# Patient Record
Sex: Female | Born: 1955 | Race: White | Hispanic: No | Marital: Married | Smoking: Never smoker
Health system: Southern US, Community
[De-identification: ages and names within clinical notes are randomized; demographics above are authoritative.]

## PROBLEM LIST (undated history)

## (undated) DIAGNOSIS — E28319 Asymptomatic premature menopause: Secondary | ICD-10-CM

## (undated) DIAGNOSIS — B019 Varicella without complication: Secondary | ICD-10-CM

## (undated) DIAGNOSIS — I10 Essential (primary) hypertension: Secondary | ICD-10-CM

## (undated) DIAGNOSIS — I480 Paroxysmal atrial fibrillation: Secondary | ICD-10-CM

## (undated) DIAGNOSIS — I4891 Unspecified atrial fibrillation: Secondary | ICD-10-CM

## (undated) DIAGNOSIS — E785 Hyperlipidemia, unspecified: Secondary | ICD-10-CM

## (undated) DIAGNOSIS — O24419 Gestational diabetes mellitus in pregnancy, unspecified control: Secondary | ICD-10-CM

## (undated) DIAGNOSIS — M2550 Pain in unspecified joint: Secondary | ICD-10-CM

## (undated) HISTORY — DX: Asymptomatic premature menopause: E28.319

## (undated) HISTORY — DX: Gestational diabetes mellitus in pregnancy, unspecified control: O24.419

## (undated) HISTORY — DX: Hyperlipidemia, unspecified: E78.5

## (undated) HISTORY — DX: Unspecified atrial fibrillation: I48.91

## (undated) HISTORY — PX: TONSILECTOMY, ADENOIDECTOMY, BILATERAL MYRINGOTOMY AND TUBES: SHX2538

## (undated) HISTORY — DX: Pain in unspecified joint: M25.50

## (undated) HISTORY — DX: Essential (primary) hypertension: I10

## (undated) HISTORY — DX: Varicella without complication: B01.9

## (undated) HISTORY — PX: ATRIAL FIBRILLATION ABLATION: SHX5732

---

## 2006-11-01 ENCOUNTER — Emergency Department (HOSPITAL_COMMUNITY): Admission: EM | Admit: 2006-11-01 | Discharge: 2006-11-02 | Payer: Self-pay | Admitting: Emergency Medicine

## 2006-12-18 ENCOUNTER — Emergency Department (HOSPITAL_COMMUNITY): Admission: EM | Admit: 2006-12-18 | Discharge: 2006-12-18 | Payer: Self-pay | Admitting: Emergency Medicine

## 2007-10-13 ENCOUNTER — Ambulatory Visit: Payer: Self-pay | Admitting: Internal Medicine

## 2007-10-13 DIAGNOSIS — R233 Spontaneous ecchymoses: Secondary | ICD-10-CM | POA: Insufficient documentation

## 2007-10-13 DIAGNOSIS — R5383 Other fatigue: Secondary | ICD-10-CM

## 2007-10-13 DIAGNOSIS — O99810 Abnormal glucose complicating pregnancy: Secondary | ICD-10-CM | POA: Insufficient documentation

## 2007-10-13 DIAGNOSIS — M2550 Pain in unspecified joint: Secondary | ICD-10-CM | POA: Insufficient documentation

## 2007-10-13 DIAGNOSIS — Z9189 Other specified personal risk factors, not elsewhere classified: Secondary | ICD-10-CM | POA: Insufficient documentation

## 2007-10-13 DIAGNOSIS — E785 Hyperlipidemia, unspecified: Secondary | ICD-10-CM | POA: Insufficient documentation

## 2007-10-13 DIAGNOSIS — I10 Essential (primary) hypertension: Secondary | ICD-10-CM | POA: Insufficient documentation

## 2007-10-13 DIAGNOSIS — J309 Allergic rhinitis, unspecified: Secondary | ICD-10-CM | POA: Insufficient documentation

## 2007-10-13 DIAGNOSIS — J301 Allergic rhinitis due to pollen: Secondary | ICD-10-CM | POA: Insufficient documentation

## 2007-10-13 DIAGNOSIS — E28319 Asymptomatic premature menopause: Secondary | ICD-10-CM | POA: Insufficient documentation

## 2007-10-13 DIAGNOSIS — R5381 Other malaise: Secondary | ICD-10-CM | POA: Insufficient documentation

## 2007-10-13 LAB — CONVERTED CEMR LAB
ALT: 20 units/L (ref 0–35)
AST: 16 units/L (ref 0–37)
Albumin: 3.9 g/dL (ref 3.5–5.2)
Alkaline Phosphatase: 52 units/L (ref 39–117)
BUN: 14 mg/dL (ref 6–23)
Basophils Relative: 0.6 % (ref 0.0–1.0)
Bilirubin Urine: NEGATIVE
Chloride: 104 meq/L (ref 96–112)
Cholesterol: 205 mg/dL (ref 0–200)
Creatinine, Ser: 0.8 mg/dL (ref 0.4–1.2)
Direct LDL: 151.7 mg/dL
Eosinophils Absolute: 0.1 10*3/uL (ref 0.0–0.7)
Eosinophils Relative: 3.2 % (ref 0.0–5.0)
GFR calc non Af Amer: 80 mL/min
Glucose, Bld: 98 mg/dL (ref 70–99)
HCT: 40.9 % (ref 36.0–46.0)
INR: 1.1 — ABNORMAL HIGH (ref 0.8–1.0)
Ketones, ur: NEGATIVE mg/dL
MCV: 92.7 fL (ref 78.0–100.0)
Monocytes Relative: 6.7 % (ref 3.0–12.0)
Neutrophils Relative %: 44.6 % (ref 43.0–77.0)
Nitrite: NEGATIVE
RBC: 4.42 M/uL (ref 3.87–5.11)
Total Protein, Urine: NEGATIVE mg/dL
Total Protein: 7.1 g/dL (ref 6.0–8.3)
Triglycerides: 121 mg/dL (ref 0–149)
WBC: 4 10*3/uL — ABNORMAL LOW (ref 4.5–10.5)
pH: 6 (ref 5.0–8.0)

## 2007-10-17 ENCOUNTER — Telehealth: Payer: Self-pay | Admitting: Internal Medicine

## 2007-10-17 LAB — CONVERTED CEMR LAB
Anti Nuclear Antibody(ANA): NEGATIVE
Vit D, 1,25-Dihydroxy: 25 — ABNORMAL LOW (ref 30–89)

## 2008-04-22 ENCOUNTER — Observation Stay (HOSPITAL_COMMUNITY): Admission: EM | Admit: 2008-04-22 | Discharge: 2008-04-23 | Payer: Self-pay | Admitting: Emergency Medicine

## 2008-04-22 ENCOUNTER — Ambulatory Visit: Payer: Self-pay | Admitting: Internal Medicine

## 2008-06-06 ENCOUNTER — Encounter: Payer: Self-pay | Admitting: Internal Medicine

## 2008-06-06 ENCOUNTER — Ambulatory Visit: Payer: Self-pay

## 2008-06-13 ENCOUNTER — Ambulatory Visit: Payer: Self-pay | Admitting: Internal Medicine

## 2008-06-26 ENCOUNTER — Ambulatory Visit: Payer: Self-pay | Admitting: Internal Medicine

## 2008-06-26 ENCOUNTER — Encounter: Payer: Self-pay | Admitting: Internal Medicine

## 2008-11-08 ENCOUNTER — Ambulatory Visit: Payer: Self-pay | Admitting: Internal Medicine

## 2008-11-08 DIAGNOSIS — I498 Other specified cardiac arrhythmias: Secondary | ICD-10-CM | POA: Insufficient documentation

## 2008-11-08 DIAGNOSIS — I4891 Unspecified atrial fibrillation: Secondary | ICD-10-CM | POA: Insufficient documentation

## 2008-11-08 LAB — CONVERTED CEMR LAB
ALT: 23 units/L (ref 0–35)
AST: 22 units/L (ref 0–37)
Albumin: 3.9 g/dL (ref 3.5–5.2)
Alkaline Phosphatase: 46 units/L (ref 39–117)
Bilirubin, Direct: 0.1 mg/dL (ref 0.0–0.3)
Cholesterol: 146 mg/dL (ref 0–200)
Total Bilirubin: 1 mg/dL (ref 0.3–1.2)
Total CHOL/HDL Ratio: 4
Total Protein: 6.9 g/dL (ref 6.0–8.3)
Triglycerides: 65 mg/dL (ref 0.0–149.0)

## 2008-11-13 ENCOUNTER — Encounter: Payer: Self-pay | Admitting: Internal Medicine

## 2009-01-27 ENCOUNTER — Telehealth: Payer: Self-pay | Admitting: Internal Medicine

## 2009-06-23 ENCOUNTER — Ambulatory Visit: Payer: Self-pay | Admitting: Internal Medicine

## 2010-04-28 ENCOUNTER — Encounter: Payer: Self-pay | Admitting: Internal Medicine

## 2010-04-28 ENCOUNTER — Ambulatory Visit: Payer: Self-pay | Admitting: Internal Medicine

## 2010-06-10 NOTE — Assessment & Plan Note (Signed)
Summary: 6 month/dmp    Visit Type:  Follow-up Primary Provider:  Corwin Levins MD   History of Present Illness: [name] returns today for followup.  She is a pleasant middle aged woman with a h/o PAF who was started on flecainide several months ago after presenting in atrial fibrillation.   No syncope. When I saw her last, she had marked sinus bradycardia and she had her beta blocker stopped.  Her energy level has improved.  She notes some problem with libido but she thinks it may be related to menopause.  Current Medications (verified): 1)  Simvastatin 40 Mg  Tabs (Simvastatin) .Marland Kitchen.. 1 By Mouth Once Daily 2)  Flecainide Acetate 100 Mg Tabs (Flecainide Acetate) .... Once Twice A Daily 3)  Zyrtec Hives Relief 10 Mg Tabs (Cetirizine Hcl) .Marland Kitchen.. 1 By Mouth Once Daily 4)  Aspirin Ec 325 Mg Tbec (Aspirin) .... Take One Tablet By Mouth Daily 5)  Multivitamins   Tabs (Multiple Vitamin) .Marland Kitchen.. 1 By Mouth Once Daily  Allergies: 1)  ! Tetracycline  Past History:  Past Medical History: Last updated: 11/07/2008 PREMATURE MENOPAUSE (ICD-256.31) POLYARTHRALGIA (ICD-719.49) FATIGUE (ICD-780.79) ECCHYMOSES, SPONTANEOUS (ICD-782.7) PREVENTIVE HEALTH CARE (ICD-V70.0) ALLERGIC RHINITIS (ICD-477.9) HYPERTENSION (ICD-401.9) HYPERLIPIDEMIA (ICD-272.4) DIABETES MELLITUS, GESTATIONAL (ICD-648.80) CHICKENPOX, HX OF (ICD-V15.9) HAY FEVER (ICD-477.0)  Review of Systems  The patient denies chest pain, syncope, dyspnea on exertion, and peripheral edema.    Vital Signs:  Patient profile:   55 year old female Height:      67.5 inches Weight:      208 pounds Pulse rate:   64 / minute BP sitting:   150 / 100  (left arm)  Vitals Entered By: Laurance Flatten CMA (June 23, 2009 9:20 AM)  Physical Exam  General:  Well developed, well nourished, in no acute distress.  HEENT: normal Neck: supple. No JVD. Carotids 2+ bilaterally no bruits Cor: RRR no rubs, gallops or murmur Lungs: CTA Ab: soft,  nontender. nondistended. No HSM. Good bowel sounds Ext: warm. no cyanosis, clubbing or edema Neuro: alert and oriented. Grossly nonfocal. affect pleasant    EKG  Procedure date:  06/23/2009  Findings:      Normal sinus rhythm with rate of:  65. Poor R wave progression.  Impression & Recommendations:  Problem # 1:  ATRIAL FIBRILLATION (ICD-427.31)  Her symptoms are well controlled.  Continue current meds.  I have encouraged her to start back with regular walking. The following medications were removed from the medication list:    Metoprolol Tartrate 25 Mg Tabs (Metoprolol tartrate) .Marland Kitchen... Take one tablet by mouth twice a day Her updated medication list for this problem includes:    Flecainide Acetate 100 Mg Tabs (Flecainide acetate) ..... Once twice a daily    Aspirin Ec 325 Mg Tbec (Aspirin) .Marland Kitchen... Take one tablet by mouth daily  The following medications were removed from the medication list:    Metoprolol Tartrate 25 Mg Tabs (Metoprolol tartrate) .Marland Kitchen... Take one tablet by mouth twice a day Her updated medication list for this problem includes:    Flecainide Acetate 100 Mg Tabs (Flecainide acetate) ..... Once twice a daily    Aspirin Ec 325 Mg Tbec (Aspirin) .Marland Kitchen... Take one tablet by mouth daily  Problem # 2:  BRADYCARDIA (ICD-427.89)  Her symptoms are improved with removal of the beta blocker. The following medications were removed from the medication list:    Metoprolol Tartrate 25 Mg Tabs (Metoprolol tartrate) .Marland Kitchen... Take one tablet by mouth twice a day  Her updated medication list for this problem includes:    Flecainide Acetate 100 Mg Tabs (Flecainide acetate) ..... Once twice a daily    Aspirin Ec 325 Mg Tbec (Aspirin) .Marland Kitchen... Take one tablet by mouth daily  The following medications were removed from the medication list:    Metoprolol Tartrate 25 Mg Tabs (Metoprolol tartrate) .Marland Kitchen... Take one tablet by mouth twice a day Her updated medication list for this problem includes:     Flecainide Acetate 100 Mg Tabs (Flecainide acetate) ..... Once twice a daily    Aspirin Ec 325 Mg Tbec (Aspirin) .Marland Kitchen... Take one tablet by mouth daily  Problem # 3:  HYPERTENSION (ICD-401.9) Her blood pressure is on the high side today.  If it remains elevated when we see her back, then additional medication will be required.  A low sodium diet is recommended. The following medications were removed from the medication list:    Metoprolol Tartrate 25 Mg Tabs (Metoprolol tartrate) .Marland Kitchen... Take one tablet by mouth twice a day Her updated medication list for this problem includes:    Aspirin Ec 325 Mg Tbec (Aspirin) .Marland Kitchen... Take one tablet by mouth daily  Patient Instructions: 1)  Your physician recommends that you schedule a follow-up appointment in: 6months with Dr Ladona Ridgel Prescriptions: FLECAINIDE ACETATE 100 MG TABS (FLECAINIDE ACETATE) once twice a daily  #180 x 3   Entered by:   Dennis Bast, RN, BSN   Authorized by:   Laren Boom, MD, [facility]   Signed by:   Dennis Bast, RN, BSN on 06/23/2009   Method used:   Faxed to ...       [facility] ([facility])       [address] [place], [place]  [address]       Ph: [phone]       Fax: [phone]   RxID:   [id number]

## 2010-06-11 NOTE — Assessment & Plan Note (Signed)
Summary: rov per pt call/f/u on a-fib/lg    Visit Type:  Follow-up Primary Rana Adorno:  Corwin Levins MD   History of Present Illness: [name] returns today for followup of her atrial fib and HTN.  She is a pleasant 55 yo woman with a h/o atrial fib who was placed on flecainide over a year ago.  She has dones well since I last saw her 9 months ago with only minimal palpitations.  No c/p or peripheral edema.   Current Medications (verified): 1)  Simvastatin 40 Mg  Tabs (Simvastatin) .Marland Kitchen.. 1 By Mouth Once Daily 2)  Flecainide Acetate 100 Mg Tabs (Flecainide Acetate) .... Once Twice A Daily 3)  Zyrtec Hives Relief 10 Mg Tabs (Cetirizine Hcl) .Marland Kitchen.. 1 By Mouth Once Daily 4)  Aspirin Ec 325 Mg Tbec (Aspirin) .... Take One Tablet By Mouth Daily 5)  Multivitamins   Tabs (Multiple Vitamin) .Marland Kitchen.. 1 By Mouth Once Daily  Allergies: 1)  ! Tetracycline  Past History:  Past Medical History: Last updated: 11/07/2008 PREMATURE MENOPAUSE (ICD-256.31) POLYARTHRALGIA (ICD-719.49) FATIGUE (ICD-780.79) ECCHYMOSES, SPONTANEOUS (ICD-782.7) PREVENTIVE HEALTH CARE (ICD-V70.0) ALLERGIC RHINITIS (ICD-477.9) HYPERTENSION (ICD-401.9) HYPERLIPIDEMIA (ICD-272.4) DIABETES MELLITUS, GESTATIONAL (ICD-648.80) CHICKENPOX, HX OF (ICD-V15.9) HAY FEVER (ICD-477.0)  Past Surgical History: Last updated: 10/13/2007 Tonsillectomy  Review of Systems  The patient denies chest pain, syncope, dyspnea on exertion, and peripheral edema.    Vital Signs:  Patient profile:   55 year old female Height:      67.5 inches Weight:      202 pounds BMI:     31.28 Pulse rate:   66 / minute BP sitting:   130 / 88  (left arm)  Vitals Entered By: Laurance Flatten CMA (April 28, 2010 11:20 AM)  Physical Exam  General:  Well developed, well nourished, in no acute distress.  HEENT: normal Neck: supple. No JVD. Carotids 2+ bilaterally no bruits Cor: RRR no rubs, gallops or murmur Lungs: CTA Ab: soft, nontender.  nondistended. No HSM. Good bowel sounds Ext: warm. no cyanosis, clubbing or edema Neuro: alert and oriented. Grossly nonfocal. affect pleasant    EKG  Procedure date:  04/28/2010  Findings:      Normal sinus rhythm with rate of:  66.  Impression & Recommendations:  Problem # 1:  ATRIAL FIBRILLATION (ICD-427.31) Her symptoms have been well controlled.  She will continue her flecainide.   Her updated medication list for this problem includes:    Flecainide Acetate 100 Mg Tabs (Flecainide acetate) ..... Once twice a daily    Aspirin Ec 325 Mg Tbec (Aspirin) .Marland Kitchen... Take one tablet by mouth daily  Problem # 2:  HYPERTENSION (ICD-401.9) Her blood pressure appears to be well controlled on no medical therapy.  She will continue a low sodium diet. Her updated medication list for this problem includes:    Aspirin Ec 325 Mg Tbec (Aspirin) .Marland Kitchen... Take one tablet by mouth daily  Problem # 3:  DYSLIPIDEMIA (ICD-272.4) I have asked her to reduce her fat intake and to lose weight. She will continue simvastatin. Her updated medication list for this problem includes:    Simvastatin 40 Mg Tabs (Simvastatin) .Marland Kitchen... 1 by mouth once daily  Patient Instructions: 1)  Your physician wants you to follow-up in 12 month follow up with Dr Ladona Ridgel:   You will receive a reminder letter in the mail two months in advance. If you don't receive a letter, please call our office to schedule the follow-up appointment. Prescriptions: SIMVASTATIN 40 MG  TABS (SIMVASTATIN) 1 by mouth once daily  #90 Tablet x 3   Entered by:   Dennis Bast, RN, BSN   Authorized by:   Laren Boom, MD, [facility]   Signed by:   Dennis Bast, RN, BSN on 04/28/2010   Method used:   [facility]  [address] [id number]       [address]       [place], [place]  [address]       Ph: [phone] or [phone]       Fax: [phone]   RxID:   [id number] FLECAINIDE ACETATE 100 MG TABS  (FLECAINIDE ACETATE) once twice a daily  #180 x 3   Entered by:   Dennis Bast, RN, BSN   Authorized by:   Laren Boom, MD, [facility]   Signed by:   Dennis Bast, RN, BSN on 04/28/2010   Method used:   [facility]        [facility]  [address] [id number]       [address]       [place], [place]  [address]       Ph: [phone] or [phone]       Fax: [phone]   RxID:   [id number]   Prevention & Chronic Care Immunizations   Influenza vaccine: Not documented    Tetanus booster: 08/09/2007: given    Pneumococcal vaccine: Not documented  Colorectal Screening   Hemoccult: Not documented    Colonoscopy: Not documented  Other Screening   Pap smear: normal  (04/11/2006)    Mammogram: normal  (04/11/2006)   Smoking status: never  (10/13/2007)  Lipids   Total Cholesterol: 146  (11/08/2008)   LDL: 91  (11/08/2008)   LDL Direct: 151.7  (10/13/2007)   HDL: 41.70  (11/08/2008)   Triglycerides: 65.0  (11/08/2008)    SGOT (AST): 22  (11/08/2008)   SGPT (ALT): 23  (11/08/2008)   Alkaline phosphatase: 46  (11/08/2008)   Total bilirubin: 1.0  (11/08/2008)  Hypertension   Last Blood Pressure: 130 / 88  (04/28/2010)   Serum creatinine: 0.8  (10/13/2007)   Serum potassium 4.2  (10/13/2007)  Self-Management Support :    Hypertension self-management support: Not documented    Lipid self-management support: Not documented

## 2010-09-22 NOTE — Assessment & Plan Note (Signed)
[facility]                         ELECTROPHYSIOLOGY OFFICE NOTE   COLISHA, REDLER                  MRN:          [id number]  DATE:06/13/2008                            DOB:          04-Jun-1955    [name] returns today for followup.  I saw her in the emergency room  at [facility] early in December when she presented with atrial  fibrillation and rapid ventricular response.  The patient at that time  was treated with flecainide and went back to sinus rhythm.  In the  interim, she has gone about 6 weeks on beta-blocker therapy and she  notes that she has been out of rhythm 3 or 4 times during that period,  typically lasting several hours at a time.  This symptoms have not been  quite as severe as they were when she was in the emergency room and now  that she is on beta-blockers, but she still feels palpitations and some  dyspnea with her AFib.   CURRENT MEDICATIONS:  1. Metoprolol 25 mg half tablet twice daily.  2. Simvastatin 40 a day.   PHYSICAL EXAMINATION:  GENERAL:  She is a pleasant well-appearing middle-  aged woman in no acute distress.  VITAL SIGNS:  Blood pressure was 150/70, the pulse was 130 and  irregularly irregular, respirations were 18, and the weight was 199  pounds.  NECK:  No jugular venous distention.  LUNGS:  Clear bilaterally to auscultation.  No wheezes, rales, or  rhonchi are present.  There is no increased work of breathing.  CARDIOVASCULAR:  Irregularly regular tachycardia with normal S1 and S2.  ABDOMEN:  Soft and nontender.  EXTREMITIES:  No cyanosis, clubbing, or edema.  The pulses are 2+ and  symmetric.   The EKG demonstrates atrial fibrillation with a rapid ventricular  response at 157 beats per minute.   IMPRESSION:  1. Paroxysmal atrial fibrillation.  Currently in atrial fibrillation,      though not with any evidence of heart failure.  2. Dyslipidemia.   DISCUSSION:  Overall, [name] is  stable except that she is presently  out of rhythm in AFib.  Because she is stable and does not appear to be  clinically in any distress, I have not recommended that she be  hospitalized today.  However, I have recommended because of the  increasingly frequent episodes of her AFib that we start her on  flecainide and we will give her prescription today for 100 mg twice  daily.  We will have her come back in 7-10 days for an exercise  treadmill test to rule out proarrhythmia on flecainide.  I noted that  her 2-D echo was done several weeks ago, demonstrated a structurally  normal heart with normal left atrial and right atrial sizes and  dimensions and no significant valvular problems.  Hopefully, we can  maintain sinus rhythm with a strategy of rhythm control on Mr. [name].  Today, we also briefly  talked about ablation therapy for AFib, but I think at the present time,  we will try her on flecainide to see if we can control arrhythmias in  this way.     Doylene Canning. Ladona Ridgel, MD  Electronically Signed    GWT/MedQ  DD: 06/13/2008  DT: 06/14/2008  Job #: [id number]   cc:   Corwin Levins, MD

## 2010-09-22 NOTE — Discharge Summary (Signed)
[name], [name]         ACCOUNT Harris.:  [id number]   MEDICAL RECORD Harris.:  [id number]          PATIENT TYPE:  OBV   LOCATION:  1433                         FACILITY:  [facility]   PHYSICIAN:  Rollene Rotunda, MD, FACCDATE OF BIRTH:  1955-11-14   DATE OF ADMISSION:  04/22/2008  DATE OF DISCHARGE:                               DISCHARGE SUMMARY   PRIMARY FINAL DISCHARGE DIAGNOSIS:  Paroxysmal atrial fibrillation with  rapid ventricular response.   SECONDARY DIAGNOSES:  1. Polyarthralgia/fatigue.  2. Premature menopause.  3. Allergic rhinitis.  4. Hypertension.  5. Hyperlipidemia with a total cholesterol 193, triglycerides 82, HDL      51, LDL 126 on medication.  6. History of gestational diabetes.   Time at discharge:  41 minutes.   HOSPITAL COURSE:  [name] is a 55 year old female with Harris  previous history of coronary artery disease.  She has a long history of  palpitations which were brief and never caught on telemetry.  When she  developed palpitations on the day of admission and they lasted a  prolonged period of time, she came to the emergency room where she was  found to be in atrial fibrillation rapid ventricular rate.   Her cardiac enzymes were negative for MI.  Her TSH was within normal  limits at 2.125.  Chest x-ray showed Harris acute disease.  A nonfasting  blood sugar was 128.  Her other labs were within normal limits except  for a mildly elevated hemoglobin at 15.6 and a mildly elevated  hematocrit at 46.1.   [name] was given IV Diltiazem for rate control, but her heart  rate was still greater than 100.  She was also started on heparin and  metoprolol 25 mg q.6 hours.  She was given a flecainide challenge with  200 mg in the emergency room.  At 5:30 she converted to sinus rhythm  with a heart rate in the 70s.  Once her heart rate converted to sinus  rhythm, she was asymptomatic.   On April 23, 2008 [name] was seen by Dr.  Antoine Poche.  She  was maintaining sinus rhythm, but because of a systolic blood pressure  less than 100 one of the doses of metoprolol had been held and because  of a heart rate less than 60 another one had been held.  Dr. Antoine Poche  therefore discontinued the Cardizem and decreased the beta blocker.  As  [name] was, otherwise, asymptomatic he felt that she could be  considered stable for discharge home on a decreased dose of beta blocker  and with outpatient follow-up and echocardiogram.   DISCHARGE INSTRUCTIONS:  1. Her activity level is to be increased gradually.  2. She is encouraged to stick to a low sodium, heart-healthy diet.  3. She is to call our office for any further episodes of palpitations.  4. She is to get an echocardiogram January the 11th at 1 p.m. and see      Dr. Ladona Ridgel at 2:30.  She is to follow up with Dr. Jonny Ruiz as needed.   DISCHARGE MEDICATIONS:  1. Metoprolol 25 mg 1/2 tab b.i.d.  2. Nasacort  daily.  3. Simvastatin 40 mg a day.  4. Zyrtec 10 mg daily.      Theodore Demark, PA-C      Rollene Rotunda, MD, [facility]  Electronically Signed    RB/MEDQ  D:  04/23/2008  T:  04/23/2008  Job:  [id number]   cc:   Corwin Levins, MD  [address]  [place]  [place] [address]

## 2010-09-22 NOTE — H&P (Signed)
NAMESHERILL, MANGEN         ACCOUNT NO.:  [id number]   MEDICAL RECORD NO.:  [id number]          PATIENT TYPE:  OBV   LOCATION:  0104                         FACILITY:  [facility]   PHYSICIAN:  Doylene Canning. Ladona Ridgel, MD    DATE OF BIRTH:  [birth year]   DATE OF ADMISSION:  04/22/2008  DATE OF DISCHARGE:                              HISTORY & PHYSICAL   ADMITTING DIAGNOSIS:  New-onset atrial fibrillation with a rapid  ventricular response associated with shortness of breath.   CHIEF COMPLAINT:  Palpitations and shortness of breath.   HISTORY OF PRESENT ILLNESS:  The patient is a very pleasant 55 year old  woman with a history of palpitations in the past, but never diagnosed  with atrial fibrillation.  She has a history of acute on chronic  sinusitis and associated headaches.  The patient was in her usual state  of health when she woke up this morning complaining of some dizziness  and associated palpitations.  Despite this, she went to work but her  symptoms worsened.  She became lightheaded though she did not have any  frank syncope.  She noted weakness and fatigue and nausea.  She  subsequently went to the emergency department where she was found to be  in atrial fibrillation with a rapid ventricular response and is admitted  for evaluation.  She has been given intravenous Cardizem.  Her  ventricular rate and atrial fibrillation remain elevated.  She has had  no frank syncope.  She denies classic anginal symptoms.  She denies any  history of or symptoms of hyperthyroidism.   CURRENT MEDICATIONS:  1. Zyrtec 10 a day.  2. Nasacort.  3. Vitamin D.  4. Zocor 40 a day.   PAST MEDICAL HISTORY:  1. Dyslipidemia.  2. Borderline hypertension.   SOCIAL HISTORY:  The patient is married and lives in [place].  She works as a Immunologist.  She denies tobacco or ethanol abuse.   FAMILY HISTORY:  Not contributory.   REVIEW OF SYSTEMS:  Notable for problems with chronic congestion  and  nasal discharge and headache associated with her sinusitis.  She denies  any fevers or chills, or significant cough.  Of course, she has  palpitations and shortness of breath with her new-onset atrial  fibrillation.  Otherwise, the rest of her review of systems was negative  except as noted in the HPI.   PHYSICAL EXAMINATION:  GENERAL:  She is a pleasant, well-appearing, 55-  year-old woman in no acute distress.  VITAL SIGNS:  The blood pressure was 180/130.  The pulse was 148 and  irregularly irregular.  Respirations are 24.  Temperature is 98.  HEENT:  Normocephalic and atraumatic. Pupils equal and round.  The  oropharynx is moist.  The sclerae are anicteric.  The neck revealed 7 cm  jugular venous distention.  There is no thyromegaly.  The trachea is  midline.  The carotids are 2+ and symmetric.  CHEST:  The lungs revealed rales in the bases bilaterally.  No wheezes  or rhonchi.  There is no increased work at breathing.  CARDIOVASCULAR:  Irregularly irregular tachycardia with normal S1 and  S2.  PMI was not enlarged nor laterally displaced.  I did not appreciate  any murmurs in the emergency room.  ABDOMEN:  Soft, nontender.  There is no organomegaly.  The bowel sounds  are present.  No rebound or guarding.  EXTREMITIES:  No cyanosis, clubbing, or edema.  There were erythematous  macules over the lower extremities; etiology is unknown as to what these  might be.   Her EKG demonstrates atrial fibrillation with a rapid ventricular  response.  Her labs demonstrate a normal hemoglobin, normal kidney  function, normal potassium.   INITIAL POINT OF CARE:  Cardiac enzymes are negative.   IMPRESSION:  1. New-onset atrial fibrillation with a rapid ventricular response.  2. Shortness of breath secondary to #1 without any diagnosis of heart      failure in the past.  3. Borderline hypertension.  4. Chronic sinusitis, questionably related to #1.   DISCUSSION:  We will plan to admit  the patient to the hospital and  continue IV Cardizem.  We will give her some beta blockers.  We will  give her some flecainide in hopes of trying to convert her back to sinus  rhythm.  We will obtain serial cardiac enzymes.  If she converts to  sinus, then we will plan for early discharge and outpatient followup  with a 2-D echo.  If she remains in atrial fibrillation, then  consideration for DC cardioversion would be considered and this would be  scheduled tomorrow.  We will plan to make sure the patient remains  n.p.o. after midnight tonight.  Obviously, if her cardiac enzymes were  remarkably elevated, then we would evaluate for coronary disease, as  well.  Thyroids will be checked, as well.      Doylene Canning. Ladona Ridgel, MD  Electronically Signed     GWT/MEDQ  D:  04/22/2008  T:  04/22/2008  Job:  [id number]   cc:   Corwin Levins, MD  [address]  [place]  [place] [address]

## 2010-10-21 ENCOUNTER — Other Ambulatory Visit: Payer: Self-pay | Admitting: *Deleted

## 2010-10-21 MED ORDER — SIMVASTATIN 40 MG PO TABS: 40.0000 mg | ORAL_TABLET | Freq: Every evening | ORAL | Status: DC

## 2010-11-16 ENCOUNTER — Other Ambulatory Visit (HOSPITAL_COMMUNITY)
Admission: RE | Admit: 2010-11-16 | Discharge: 2010-11-16 | Disposition: A | Discharge status: Home / Self Care | Payer: 59 | Source: Ambulatory Visit | Attending: Obstetrics and Gynecology | Admitting: Obstetrics and Gynecology

## 2010-11-16 ENCOUNTER — Other Ambulatory Visit: Payer: Self-pay | Admitting: Obstetrics and Gynecology

## 2010-11-16 DIAGNOSIS — Z01419 Encounter for gynecological examination (general) (routine) without abnormal findings: Secondary | ICD-10-CM | POA: Insufficient documentation

## 2010-11-24 ENCOUNTER — Other Ambulatory Visit: Payer: Self-pay | Admitting: Obstetrics and Gynecology

## 2010-11-24 DIAGNOSIS — Z1231 Encounter for screening mammogram for malignant neoplasm of breast: Secondary | ICD-10-CM

## 2010-12-07 ENCOUNTER — Ambulatory Visit
Admission: RE | Admit: 2010-12-07 | Discharge: 2010-12-07 | Disposition: A | Discharge status: Home / Self Care | Payer: 59 | Source: Ambulatory Visit | Attending: Obstetrics and Gynecology | Admitting: Obstetrics and Gynecology

## 2010-12-07 DIAGNOSIS — Z1231 Encounter for screening mammogram for malignant neoplasm of breast: Secondary | ICD-10-CM

## 2011-02-12 LAB — TROPONIN I: Troponin I: 0.01 ng/mL (ref 0.00–0.06)

## 2011-02-12 LAB — POCT CARDIAC MARKERS
CKMB, poc: 1.5 ng/mL (ref 1.0–8.0)
Troponin i, poc: <0.05 ng/mL (ref 0.00–0.09)

## 2011-02-12 LAB — BASIC METABOLIC PANEL
CO2: 25 mEq/L (ref 19–32)
Chloride: 105 mEq/L (ref 96–112)
Creatinine, Ser: 0.81 mg/dL (ref 0.4–1.2)
GFR calc Af Amer: >60 mL/min (ref >60)

## 2011-02-12 LAB — CBC
MCHC: 33.8 g/dL (ref 30.0–36.0)
MCV: 94.1 fL (ref 78.0–100.0)
RBC: 4.9 MIL/uL (ref 3.87–5.11)

## 2011-02-12 LAB — CARDIAC PANEL(CRET KIN+CKTOT+MB+TROPI)
CK, MB: 1.2 ng/mL (ref 0.3–4.0)
CK, MB: 1.8 ng/mL (ref 0.3–4.0)
Troponin I: <0.01 ng/mL (ref 0.00–0.06)

## 2011-02-12 LAB — CK TOTAL AND CKMB (NOT AT ARMC)
CK, MB: 2.6 ng/mL (ref 0.3–4.0)
Total CK: 109 U/L (ref 7–177)

## 2011-02-12 LAB — LIPID PANEL
LDL Cholesterol: 126 mg/dL — ABNORMAL HIGH (ref 0–99)
Triglycerides: 82 mg/dL (ref <150)

## 2011-02-12 LAB — PROTIME-INR: Prothrombin Time: 13 seconds (ref 11.6–15.2)

## 2011-04-12 ENCOUNTER — Telehealth: Payer: Self-pay | Admitting: Internal Medicine

## 2011-04-12 MED ORDER — FLECAINIDE ACETATE 100 MG PO TABS: 100.0000 mg | ORAL_TABLET | Freq: Two times a day (BID) | ORAL | Status: DC

## 2011-04-12 NOTE — Telephone Encounter (Signed)
[facility]. [facility] [address]

## 2011-05-28 ENCOUNTER — Encounter: Payer: Self-pay | Admitting: Internal Medicine

## 2011-06-03 ENCOUNTER — Ambulatory Visit (INDEPENDENT_AMBULATORY_CARE_PROVIDER_SITE_OTHER): Payer: 59 | Admitting: Internal Medicine

## 2011-06-03 ENCOUNTER — Encounter: Payer: Self-pay | Admitting: Internal Medicine

## 2011-06-03 DIAGNOSIS — I10 Essential (primary) hypertension: Secondary | ICD-10-CM

## 2011-06-03 DIAGNOSIS — I4891 Unspecified atrial fibrillation: Secondary | ICD-10-CM

## 2011-06-03 DIAGNOSIS — E785 Hyperlipidemia, unspecified: Secondary | ICD-10-CM

## 2011-06-03 MED ORDER — SIMVASTATIN 40 MG PO TABS: 40.0000 mg | ORAL_TABLET | Freq: Every evening | ORAL | Status: DC

## 2011-06-03 MED ORDER — FLECAINIDE ACETATE 100 MG PO TABS: 100.0000 mg | ORAL_TABLET | Freq: Two times a day (BID) | ORAL | Status: DC

## 2011-06-03 NOTE — Progress Notes (Signed)
HPI [name] returns today for followup. She is a very pleasant 56 year old woman with a history of paroxysmal atrial fibrillation, dyslipidemia, and well-controlled hypertension. While on flecainide, she has maintained sinus rhythm very nicely. Whenever she misses a dose of her medication, she will have recurrent tachypalpitations. She denies syncope, chest pain, or shortness of breath. Allergies  Allergen Reactions  . Tetracycline     REACTION: rash     Current Outpatient Prescriptions  Medication Sig Dispense Refill  . flecainide (TAMBOCOR) 100 MG tablet Take 1 tablet (100 mg total) by mouth 2 (two) times daily.  180 tablet  3  . simvastatin (ZOCOR) 40 MG tablet Take 1 tablet (40 mg total) by mouth every evening.  30 tablet  11     Past Medical History  Diagnosis Date  . Premature menopause   . Polyarthralgia   . Fatigue   . Spontaneous ecchymoses   . Allergic rhinitis   . HTN (hypertension)   . Hyperlipidemia   . DM mellitus, gestational   . Chicken pox   . Hay fever     ROS:   All systems reviewed and negative except as noted in the HPI.   Past Surgical History  Procedure Date  . Doppler echocardiography 2010  . Tonsilectomy, adenoidectomy, bilateral myringotomy and tubes      Family History  Problem Relation Age of Onset  . Stroke Father     TIA/ pacemker  . Cancer Father   . Coronary artery disease Father   . Coronary artery disease Mother   . Stroke Other     grandfather  . Diabetes Other     grandmother  . Stroke Sister   . Stroke Other     aunt  . Stroke Other     unlce     History   Social History  . Marital Status: Married    Spouse Name: N/A    Number of Children: 2  . Years of Education: N/A   Occupational History  . Dept. Production designer, [employer] Other   Social History Main Topics  . Smoking status: Never Smoker   . Smokeless tobacco: Not on file  . Alcohol Use: Yes  . Drug Use: Not on file  . Sexually Active: Not on  file   Other Topics Concern  . Not on file   Social History Narrative  . No narrative on file     BP 124/80  Pulse 58  Ht 5' 7.5" (1.715 m)  Wt 89.867 kg (198 lb 1.9 oz)  BMI 30.57 kg/m2  Physical Exam:  Well appearing middle aged woman, NAD HEENT: Unremarkable Neck:  No JVD, no thyromegally Lungs:  Clear with no wheezes, rales, or rhonchi. HEART:  Regular rate rhythm, no murmurs, no rubs, no clicks Abd:  soft, positive bowel sounds, no organomegally, no rebound, no guarding Ext:  2 plus pulses, no edema, no cyanosis, no clubbing Skin:  No rashes no nodules Neuro:  CN II through XII intact, motor grossly intact  EKG Normal sinus rhythm. Left atrial enlargement  Assess/Plan:

## 2011-06-03 NOTE — Assessment & Plan Note (Signed)
Her blood pressure today is well controlled. She will maintain a low-sodium diet.

## 2011-06-03 NOTE — Assessment & Plan Note (Signed)
The patient is maintaining sinus rhythm very nicely on her antiarrhythmic drug therapy. She will continue her current medications. She is instructed to call as if she has breakthrough

## 2011-06-03 NOTE — Patient Instructions (Signed)
Your physician wants you to follow-up in: 12 months with Dr. Taylor. You will receive a reminder letter in the mail two months in advance. If you don't receive a letter, please call our office to schedule the follow-up appointment.    

## 2011-06-03 NOTE — Assessment & Plan Note (Signed)
She will continue her dose of simvastatin. A low-fat diet is recommended. She is losing weight by exercising.

## 2011-10-24 ENCOUNTER — Emergency Department (HOSPITAL_COMMUNITY)
Admission: EM | Admit: 2011-10-24 | Discharge: 2011-10-24 | Disposition: A | Discharge status: Home / Self Care | Payer: Private Health Insurance - Indemnity | Attending: Emergency Medicine | Admitting: Emergency Medicine

## 2011-10-24 ENCOUNTER — Encounter (HOSPITAL_COMMUNITY): Payer: Self-pay | Admitting: Family Medicine

## 2011-10-24 ENCOUNTER — Emergency Department (HOSPITAL_COMMUNITY): Payer: Private Health Insurance - Indemnity

## 2011-10-24 DIAGNOSIS — I10 Essential (primary) hypertension: Secondary | ICD-10-CM | POA: Insufficient documentation

## 2011-10-24 DIAGNOSIS — S6990XA Unspecified injury of unspecified wrist, hand and finger(s), initial encounter: Secondary | ICD-10-CM | POA: Insufficient documentation

## 2011-10-24 DIAGNOSIS — Y998 Other external cause status: Secondary | ICD-10-CM | POA: Insufficient documentation

## 2011-10-24 DIAGNOSIS — E785 Hyperlipidemia, unspecified: Secondary | ICD-10-CM | POA: Insufficient documentation

## 2011-10-24 DIAGNOSIS — W19XXXA Unspecified fall, initial encounter: Secondary | ICD-10-CM | POA: Insufficient documentation

## 2011-10-24 DIAGNOSIS — S59909A Unspecified injury of unspecified elbow, initial encounter: Secondary | ICD-10-CM | POA: Insufficient documentation

## 2011-10-24 DIAGNOSIS — Y9389 Activity, other specified: Secondary | ICD-10-CM | POA: Insufficient documentation

## 2011-10-24 NOTE — Discharge Instructions (Signed)
Elbow Injury   Minor fractures, sprains, and bruises of the elbow will all cause swelling and pain. X-rays often show swelling around the joint but may not show a fracture line on x-rays taken right after the injury. The treatment for all these types of injuries is to reduce swelling and pain and to rest the joint until movement is painless. Repeat exam and x-rays several weeks after an elbow injury may show a minor fracture not seen on the initial exam.   Most of the time a sling is needed for the first days or weeks after the injury. Apply ice packs to the elbow for 20-30 minutes every 2 hours for the next few days. Keep your elbow elevated above the level of your heart as much as possible until the pain and swelling are better. An elastic wrap or splint may also be used to reduce movement in addition to a sling. Call your caregiver for follow-up care within one week. Keeping the elbow immobilized for too long can hurt recovery.   SEEK MEDICAL CARE IF:   Your pain increases, or if you develop a numb, cold, or pale forearm or hand.   You are not improving.   You have any other questions or concerns regarding your injury.   Document Released: 06/03/2004 Document Revised: 04/15/2011 Document Reviewed: 05/15/2008   [employer] Patient Information 2012 [employer].

## 2011-10-24 NOTE — ED Notes (Signed)
Reports lost balance while getting dressed & fell this am. C/o pain right elbow, denies other injuries. No obvious deformity

## 2011-10-24 NOTE — ED Provider Notes (Signed)
History     CSN: [id number]  Arrival date & time 10/24/11  8469   First MD Initiated Contact with Patient 10/24/11 0820      Chief Complaint  Patient presents with  . Fall  . Arm Pain    (Consider location/radiation/quality/duration/timing/severity/associated sxs/prior treatment) HPI Comments: Patient reports that just prior to arrival she was putting on her pants and her legs got caught in the pants and she fell to the ground.  She landed on her right elbow.  She is now having pain of her right elbow.  She has not taken anything for pain.  Pain worse with movement and with palpation.  No swelling or bruising.  No numbness or tingling.  No prior injury to the elbow.    The history is provided by the patient.    Past Medical History  Diagnosis Date  . Premature menopause   . Polyarthralgia   . Fatigue   . Spontaneous ecchymoses   . Allergic rhinitis   . HTN (hypertension)   . Hyperlipidemia   . DM mellitus, gestational   . Chicken pox   . Hay fever     Past Surgical History  Procedure Date  . Doppler echocardiography 2010  . Tonsilectomy, adenoidectomy, bilateral myringotomy and tubes     Family History  Problem Relation Age of Onset  . Stroke Father     TIA/ pacemker  . Cancer Father   . Coronary artery disease Father   . Coronary artery disease Mother   . Stroke Other     grandfather  . Diabetes Other     grandmother  . Stroke Sister   . Stroke Other     aunt  . Stroke Other     unlce    History  Substance Use Topics  . Smoking status: Never Smoker   . Smokeless tobacco: Not on file  . Alcohol Use: Yes    OB History    Grav Para Term Preterm Abortions TAB SAB Ect Mult Living                  Review of Systems  Constitutional: Negative for fever and chills.  Gastrointestinal: Negative for nausea and vomiting.  Musculoskeletal: Negative for joint swelling.       Right elbow pain  Skin: Negative for color change and wound.  Neurological:  Negative for dizziness, syncope, weakness, light-headedness and numbness.    Allergies  Tetracycline  Home Medications   Current Outpatient Rx  Name Route Sig Dispense Refill  . ASPIRIN 325 MG PO TBEC Oral Take 325 mg by mouth daily.    Marland Kitchen VITAMIN D 1000 UNITS PO TABS Oral Take 1,000 Units by mouth daily.    Marland Kitchen FLECAINIDE ACETATE 100 MG PO TABS Oral Take 1 tablet (100 mg total) by mouth 2 (two) times daily. 60 tablet 11  . MULTI-VITAMIN/MINERALS PO TABS Oral Take 1 tablet by mouth daily.    Marland Kitchen SIMVASTATIN 40 MG PO TABS Oral Take 1 tablet (40 mg total) by mouth every evening. 30 tablet 11  . TRIAMCINOLONE ACETONIDE 55 MCG/ACT NA INHA Nasal Place 2 sprays into the nose daily.      BP 164/93  Pulse 65  Temp 98.2 F (36.8 C) (Oral)  Resp 20  SpO2 97%  Physical Exam  Nursing note and vitals reviewed. Constitutional: She appears well-developed and well-nourished.  HENT:  Head: Normocephalic and atraumatic.  Mouth/Throat: Oropharynx is clear and moist.  Cardiovascular: Normal rate, regular rhythm and  normal heart sounds.   Pulses:      Radial pulses are 2+ on the right side, and 2+ on the left side.  Pulmonary/Chest: Effort normal and breath sounds normal.  Musculoskeletal:       Right shoulder: She exhibits normal range of motion, no tenderness, no bony tenderness, no swelling, no deformity, normal pulse and normal strength.       Right elbow: She exhibits decreased range of motion. She exhibits no swelling, no effusion and no deformity. tenderness found. Medial epicondyle, lateral epicondyle and olecranon process tenderness noted.       Right wrist: She exhibits normal range of motion, no tenderness, no bony tenderness, no swelling and no deformity.  Neurological: She is alert. No sensory deficit. Gait normal.  Skin: Skin is warm and dry.  Psychiatric: She has a normal mood and affect.    ED Course  Procedures (including critical care time)  Labs Reviewed - No data to  display Dg Elbow Complete Right  10/24/2011  *RADIOLOGY REPORT*  Clinical Data: History of fall complaining of arm pain.  RIGHT ELBOW - COMPLETE 3+ VIEW  Comparison: No priors.  Findings: Four views of the right elbow demonstrate no acute fracture, subluxation, dislocation, joint or soft tissue abnormality.  IMPRESSION:  1.  No radiographic evidence of significant acute traumatic injury to the right elbow.  Original Report Authenticated By: Florencia Reasons, M.D.     1. Elbow injury       MDM  Patient with negative xray.  Neurovascularly intact.  Patient given sling for comfort.  Patient reports that she does not need any pain medication.  Patient instructed to follow up with PCP.        Pascal Lux [place], PA-C 10/24/11 1000

## 2011-10-24 NOTE — Progress Notes (Signed)
Orthopedic Tech Progress Note Patient Details:  [name] 11-Aug-1955 [id number]  Ortho Devices Type of Ortho Device: Arm foam sling Ortho Device/Splint Interventions: Application   Cammer, Mickie Bail 10/24/2011, 9:54 AM

## 2011-10-24 NOTE — ED Notes (Signed)
Per pt she fell this am while putting her pants on. Pt fell on right arm and having right arm pain around her elbow. sts she heard a crunch sound when she fell. Pt limited mobility in arm. Able to move fingers. Pulses present.

## 2011-10-24 NOTE — ED Notes (Signed)
Patient transported to X-ray 

## 2011-10-24 NOTE — ED Provider Notes (Signed)
Medical screening examination/treatment/procedure(s) were performed by non-physician practitioner and as supervising physician I was immediately available for consultation/collaboration.   Gavin Pound. Chun Sellen, MD 10/24/11 1011

## 2011-11-22 ENCOUNTER — Other Ambulatory Visit (HOSPITAL_COMMUNITY)
Admission: RE | Admit: 2011-11-22 | Discharge: 2011-11-22 | Disposition: A | Discharge status: Home / Self Care | Payer: Private Health Insurance - Indemnity | Source: Ambulatory Visit | Attending: Obstetrics and Gynecology | Admitting: Obstetrics and Gynecology

## 2011-11-22 ENCOUNTER — Other Ambulatory Visit: Payer: Self-pay | Admitting: Obstetrics and Gynecology

## 2011-11-22 DIAGNOSIS — Z1159 Encounter for screening for other viral diseases: Secondary | ICD-10-CM | POA: Insufficient documentation

## 2011-11-22 DIAGNOSIS — Z01419 Encounter for gynecological examination (general) (routine) without abnormal findings: Secondary | ICD-10-CM | POA: Insufficient documentation

## 2012-06-27 ENCOUNTER — Telehealth: Payer: Self-pay | Admitting: Internal Medicine

## 2012-06-27 DIAGNOSIS — I4891 Unspecified atrial fibrillation: Secondary | ICD-10-CM

## 2012-06-27 DIAGNOSIS — E785 Hyperlipidemia, unspecified: Secondary | ICD-10-CM

## 2012-06-27 MED ORDER — SIMVASTATIN 40 MG PO TABS: 40.0000 mg | ORAL_TABLET | Freq: Every evening | ORAL | Status: DC

## 2012-06-27 MED ORDER — FLECAINIDE ACETATE 100 MG PO TABS: 100.0000 mg | ORAL_TABLET | Freq: Two times a day (BID) | ORAL | Status: DC

## 2012-06-27 NOTE — Telephone Encounter (Signed)
Pt needs refill of flecinide and simvastatin, uses [facility] [address], pt has appt 08-08-12 with taylor

## 2012-08-08 ENCOUNTER — Ambulatory Visit (INDEPENDENT_AMBULATORY_CARE_PROVIDER_SITE_OTHER): Payer: 59 | Admitting: Internal Medicine

## 2012-08-08 ENCOUNTER — Encounter: Payer: Self-pay | Admitting: Internal Medicine

## 2012-08-08 VITALS — BP 160/85 | HR 61 | Ht 67.5 in | Wt 206.4 lb

## 2012-08-08 DIAGNOSIS — I4891 Unspecified atrial fibrillation: Secondary | ICD-10-CM

## 2012-08-08 DIAGNOSIS — I10 Essential (primary) hypertension: Secondary | ICD-10-CM

## 2012-08-08 NOTE — Progress Notes (Signed)
HPI [name] returns today for followup. She is a very pleasant 57 year old woman with a history of paroxysmal atrial fibrillation who is been controlled very nicely on chronic flecainide therapy. In the interim, she has done well. She denies chest pain, shortness of breath, syncope, or sustained palpitations. She does admit to some sedentary behavior and lifestyle. She denies peripheral edema. Allergies  Allergen Reactions  . Tetracycline     REACTION: rash     Current Outpatient Prescriptions  Medication Sig Dispense Refill  . aspirin 325 MG EC tablet Take 325 mg by mouth daily.      . cetirizine (ZYRTEC) 10 MG tablet Take 10 mg by mouth daily.      . flecainide (TAMBOCOR) 100 MG tablet Take 1 tablet (100 mg total) by mouth 2 (two) times daily.  60 tablet  11  . lisinopril (PRINIVIL,ZESTRIL) 20 MG tablet Take 20 mg by mouth 2 (two) times daily.      . Multiple Vitamins-Minerals (MULTIVITAMIN WITH MINERALS) tablet Take 1 tablet by mouth daily.      . simvastatin (ZOCOR) 40 MG tablet Take 1 tablet (40 mg total) by mouth every evening.  30 tablet  11   No current facility-administered medications for this visit.     Past Medical History  Diagnosis Date  . Premature menopause   . Polyarthralgia   . Fatigue   . Spontaneous ecchymoses   . Allergic rhinitis   . HTN (hypertension)   . Hyperlipidemia   . DM mellitus, gestational   . Chicken pox   . Hay fever     ROS:   All systems reviewed and negative except as noted in the HPI.   Past Surgical History  Procedure Laterality Date  . Doppler echocardiography  2010  . Tonsilectomy, adenoidectomy, bilateral myringotomy and tubes       Family History  Problem Relation Age of Onset  . Stroke Father     TIA/ pacemker  . Cancer Father   . Coronary artery disease Father   . Coronary artery disease Mother   . Stroke Other     grandfather  . Diabetes Other     grandmother  . Stroke Sister   . Stroke Other    aunt  . Stroke Other     unlce     History   Social History  . Marital Status: Married    Spouse Name: N/A    Number of Children: 2  . Years of Education: N/A   Occupational History  . Dept. Production designer, [employer] Other   Social History Main Topics  . Smoking status: Never Smoker   . Smokeless tobacco: Not on file  . Alcohol Use: Yes  . Drug Use: Not on file  . Sexually Active: Not on file   Other Topics Concern  . Not on file   Social History Narrative  . No narrative on file     BP 160/85  Pulse 61  Ht 5' 7.5" (1.715 m)  Wt 206 lb 6.4 oz (93.622 kg)  BMI 31.83 kg/m2  Physical Exam:  Well appearing middle-age woman,NAD HEENT: Unremarkable Neck:  No JVD, no thyromegally Lungs:  Clear HEART:  Regular rate rhythm, no murmurs, no rubs, no clicks Abd:  soft, positive bowel sounds, no organomegally, no rebound, no guarding Ext:  2 plus pulses, no edema, no cyanosis, no clubbing Skin:  No rashes no nodules Neuro:  CN II through XII intact, motor grossly intact  EKG - normal  sinus rhythm with left atrial enlargement  Assess/Plan:

## 2012-08-08 NOTE — Assessment & Plan Note (Signed)
Her blood pressure today is elevated. She notes that she checks it regularly at home, and it is typically under 130 mm mercury.

## 2012-08-08 NOTE — Assessment & Plan Note (Signed)
She is maintaining sinus rhythm very nicely. She will continue her current medical therapy. 

## 2012-08-08 NOTE — Patient Instructions (Signed)
Your physician wants you to follow-up in: 12 months with Dr. Taylor. You will receive a reminder letter in the mail two months in advance. If you don't receive a letter, please call our office to schedule the follow-up appointment.    

## 2013-03-21 ENCOUNTER — Other Ambulatory Visit: Payer: Self-pay | Admitting: Internal Medicine

## 2013-03-21 DIAGNOSIS — M25521 Pain in right elbow: Secondary | ICD-10-CM

## 2013-03-23 ENCOUNTER — Ambulatory Visit
Admission: RE | Admit: 2013-03-23 | Discharge: 2013-03-23 | Disposition: A | Discharge status: Home / Self Care | Payer: 59 | Source: Ambulatory Visit | Attending: Internal Medicine | Admitting: Internal Medicine

## 2013-03-23 DIAGNOSIS — M25521 Pain in right elbow: Secondary | ICD-10-CM

## 2013-08-13 ENCOUNTER — Other Ambulatory Visit: Payer: Self-pay | Admitting: *Deleted

## 2013-08-13 DIAGNOSIS — I4891 Unspecified atrial fibrillation: Secondary | ICD-10-CM

## 2013-08-13 DIAGNOSIS — E785 Hyperlipidemia, unspecified: Secondary | ICD-10-CM

## 2013-08-13 MED ORDER — FLECAINIDE ACETATE 100 MG PO TABS: 100.0000 mg | ORAL_TABLET | Freq: Two times a day (BID) | ORAL | Status: DC

## 2013-08-13 MED ORDER — SIMVASTATIN 40 MG PO TABS: 40.0000 mg | ORAL_TABLET | Freq: Every evening | ORAL | Status: DC

## 2013-09-19 ENCOUNTER — Ambulatory Visit (INDEPENDENT_AMBULATORY_CARE_PROVIDER_SITE_OTHER): Payer: 59 | Admitting: Internal Medicine

## 2013-09-19 ENCOUNTER — Other Ambulatory Visit: Payer: Self-pay

## 2013-09-19 ENCOUNTER — Encounter: Payer: Self-pay | Admitting: Internal Medicine

## 2013-09-19 VITALS — BP 160/88 | HR 61 | Ht 67.0 in | Wt 204.0 lb

## 2013-09-19 DIAGNOSIS — I10 Essential (primary) hypertension: Secondary | ICD-10-CM

## 2013-09-19 DIAGNOSIS — I4891 Unspecified atrial fibrillation: Secondary | ICD-10-CM

## 2013-09-19 MED ORDER — LOSARTAN POTASSIUM 100 MG PO TABS: 50.0000 mg | ORAL_TABLET | Freq: Every day | ORAL | Status: DC

## 2013-09-19 NOTE — Assessment & Plan Note (Signed)
Her blood pressure is elevated as she has been off of her meds. I have restarted losartan 50 mg daily. She coughed on lisinopril.

## 2013-09-19 NOTE — Patient Instructions (Signed)
Your physician wants you to follow-up in: 12 months with Dr. Taylor. You will receive a reminder letter in the mail two months in advance. If you don't receive a letter, please call our office to schedule the follow-up appointment.    

## 2013-09-19 NOTE — Progress Notes (Signed)
HPI Mrs. Paulson-Riley returns today for followup. She is a very pleasant 58 year old woman with a history of paroxysmal atrial fibrillation who is been controlled very nicely on chronic flecainide therapy. In the interim, she has done well. She denies chest pain, shortness of breath, syncope, or sustained palpitations. She does admit to some sedentary behavior and lifestyle. She denies peripheral edema. She has been off of her blood pressure meds. Allergies  Allergen Reactions  . Lisinopril     Dry cough  . Tetracycline     REACTION: rash     Current Outpatient Prescriptions  Medication Sig Dispense Refill  . aspirin 325 MG EC tablet Take 325 mg by mouth daily.      . cetirizine (ZYRTEC) 10 MG tablet Take 10 mg by mouth daily.      . flecainide (TAMBOCOR) 100 MG tablet Take 1 tablet (100 mg total) by mouth 2 (two) times daily.  60 tablet  2  . losartan (COZAAR) 100 MG tablet Take 100 mg by mouth daily.      . Multiple Vitamins-Minerals (MULTIVITAMIN WITH MINERALS) tablet Take 1 tablet by mouth daily.      . simvastatin (ZOCOR) 40 MG tablet Take 1 tablet (40 mg total) by mouth every evening.  30 tablet  2  . lisinopril (PRINIVIL,ZESTRIL) 20 MG tablet Take 20 mg by mouth 2 (two) times daily.       No current facility-administered medications for this visit.     Past Medical History  Diagnosis Date  . Premature menopause   . Polyarthralgia   . Fatigue   . Spontaneous ecchymoses   . Allergic rhinitis   . HTN (hypertension)   . Hyperlipidemia   . DM mellitus, gestational   . Chicken pox   . Hay fever     ROS:   All systems reviewed and negative except as noted in the HPI.   Past Surgical History  Procedure Laterality Date  . Doppler echocardiography  2010  . Tonsilectomy, adenoidectomy, bilateral myringotomy and tubes       Family History  Problem Relation Age of Onset  . Stroke Father     TIA/ pacemker  . Cancer Father   . Coronary artery disease Father   .  Coronary artery disease Mother   . Stroke Other     grandfather  . Diabetes Other     grandmother  . Stroke Sister   . Stroke Other     aunt  . Stroke Other     unlce     History   Social History  . Marital Status: Married    Spouse Name: N/A    Number of Children: 2  . Years of Education: N/A   Occupational History  . Dept. Production designer, [employer] Other   Social History Main Topics  . Smoking status: Never Smoker   . Smokeless tobacco: Not on file  . Alcohol Use: Yes  . Drug Use: Not on file  . Sexual Activity: Not on file   Other Topics Concern  . Not on file   Social History Narrative  . No narrative on file     BP 160/88  Pulse 61  Ht 5\' 7"  (1.702 m)  Wt 204 lb (92.534 kg)  BMI 31.94 kg/m2  Physical Exam:  Well appearing middle-age woman,NAD HEENT: Unremarkable Neck:  No JVD, no thyromegally Lungs:  Clear HEART:  Regular rate rhythm, no murmurs, no rubs, no clicks Abd:  soft, positive bowel sounds, no organomegally,  no rebound, no guarding Ext:  2 plus pulses, no edema, no cyanosis, no clubbing Skin:  No rashes no nodules Neuro:  CN II through XII intact, motor grossly intact  EKG - normal sinus rhythm with left atrial enlargement  Assess/Plan:

## 2013-09-19 NOTE — Assessment & Plan Note (Signed)
The patient has a CHADSVASC score of 2. I have discussed starting anti-coagulation with her. [place] in [place] and cannot afford [employer]. She will try to find a place to have her coumadin checked in [place]. If she does then we will start coumadin. Will also discussed the NOAC drugs and she cannot afford these currently. She will continue flecainide twice daily.

## 2013-11-14 ENCOUNTER — Telehealth: Payer: Self-pay | Admitting: Internal Medicine

## 2013-11-14 NOTE — Telephone Encounter (Signed)
Patient would like to take a newer pill, rather than warfarin.  Please call and advise.  She also has questions about lasartin?

## 2013-11-14 NOTE — Telephone Encounter (Signed)
Pt contacting Dr Ladona Ridgelaylor and nurse to inform that she was has been taking her prescribed Cozaar 50 mg daily, and she feels as if this is not working as effectively as when she was on Cozaar 100 mg daily.  Pt states that Dr Ladona Ridgelaylor prescribed her Cozaar 50 mg daily back in March because pt was complaining it was making her feel fatigued.  Pt would like to be prescribed Cozaar 100 mg, but written as taking 50 mg in the morning and 50 mg in the evening.    Pt also wanted to let Dr Ladona Ridgelaylor and nurse know that she is ready to discuss about starting on an anticoagulation regimen, as discussed at last OV in March.   Informed pt that Dr Ladona Ridgelaylor is in clinic at the moment and his nurse is out of the office. Informed pt that I will route this message to both Dr Ladona Ridgelaylor and nurse for further review and recommendation. Pt verbalized understanding and agrees with this plan.

## 2013-11-20 NOTE — Telephone Encounter (Signed)
Follow up     Pt is wanting to talk to someone about her bp.  She says she is waiting for Dr Lubertha Basqueaylor's nurse---the other nurses have not been able to help her.  Please call (if possible) before you leave today

## 2013-11-21 NOTE — Telephone Encounter (Signed)
Ok to increase Cozaar back to 100 mg daily. GT

## 2013-11-22 ENCOUNTER — Other Ambulatory Visit: Payer: Self-pay | Admitting: *Deleted

## 2013-11-22 ENCOUNTER — Telehealth: Payer: Self-pay | Admitting: *Deleted

## 2013-11-22 DIAGNOSIS — I4891 Unspecified atrial fibrillation: Secondary | ICD-10-CM

## 2013-11-22 DIAGNOSIS — I10 Essential (primary) hypertension: Secondary | ICD-10-CM

## 2013-11-22 MED ORDER — LOSARTAN POTASSIUM 100 MG PO TABS: 100.0000 mg | ORAL_TABLET | Freq: Every day | ORAL | Status: DC

## 2013-11-22 NOTE — Telephone Encounter (Signed)
Patient would like for you to call her to discuss anticoagulants. She has decided against coumadin at this point due to the cost of having to come in so frequently to have her inr checked, so she would like to try one of the newer agents. Please advise. Thanks, MI

## 2013-11-22 NOTE — Telephone Encounter (Signed)
Discussed with Dr Ladona Ridgelaylor. She is going to come in and get a BMP tomorrow and I will call her Monday as to what medication is going to be called in.  Savaysa 60mg  daily if Crcl <90 or Xarelto 20mg  daily

## 2013-11-23 ENCOUNTER — Other Ambulatory Visit (INDEPENDENT_AMBULATORY_CARE_PROVIDER_SITE_OTHER): Payer: 59

## 2013-11-23 DIAGNOSIS — I4891 Unspecified atrial fibrillation: Secondary | ICD-10-CM

## 2013-11-23 LAB — BASIC METABOLIC PANEL
BUN: 12 mg/dL (ref 6–23)
CHLORIDE: 105 meq/L (ref 96–112)
CO2: 29 meq/L (ref 19–32)
Calcium: 9.2 mg/dL (ref 8.4–10.5)
Creatinine, Ser: 0.9 mg/dL (ref 0.4–1.2)
GFR: 68.21 mL/min (ref >60.00)
Glucose, Bld: 86 mg/dL (ref 70–99)
POTASSIUM: 4.1 meq/L (ref 3.5–5.1)
SODIUM: 139 meq/L (ref 135–145)

## 2013-11-23 NOTE — Progress Notes (Signed)
Quick Note:  Preliminary report reviewed by triage nurse and sent to MD desk. ______ 

## 2013-11-27 ENCOUNTER — Other Ambulatory Visit: Payer: Self-pay | Admitting: Internal Medicine

## 2013-12-04 ENCOUNTER — Telehealth: Payer: Self-pay | Admitting: Internal Medicine

## 2013-12-04 MED ORDER — RIVAROXABAN 20 MG PO TABS: 20.0000 mg | ORAL_TABLET | Freq: Every day | ORAL | Status: DC

## 2013-12-04 NOTE — Telephone Encounter (Signed)
New message ° ° ° ° °Want lab results °

## 2013-12-04 NOTE — Telephone Encounter (Signed)
baed on labs will need to start Xarelto and not Savaysa  Will call in Xarelto 20mg  daily and stop ASA.  LMOM for patient and called in medication

## 2013-12-21 ENCOUNTER — Telehealth: Payer: Self-pay | Admitting: Internal Medicine

## 2013-12-21 NOTE — Telephone Encounter (Signed)
Left message for patient advising her per Dr. Ladona Ridgelaylor stop Xarelto and he will advise on what medication to take in its place.  I advised patient in the message that I will leave her a message regarding his recommendation on medication.

## 2013-12-21 NOTE — Telephone Encounter (Signed)
Spoke with patient who states she started itching and noted red rash 2 days ago in the areas under her bra and where her underwear hits her abdomen.  Patient states the only thing she has done differently in the past 2 weeks is start Xarelto; she denies using any new soaps, detergents, lotions, perfumes and has not been on any trips or been outside for long periods of time.  Patient states she has only been going to work and back home.  Last dose 8/13 @ dinner - patient states she has not missed any doses in 2 weeks.  I advised patient that I will send message to Dr. Ladona Ridgelaylor for advice and will call her back.  Patient verbalized understanding and agreement.

## 2013-12-21 NOTE — Telephone Encounter (Signed)
New message     Pt started xeralto late July.  Yesterday she broke out in an itchy rash were her bra and underwear is.  Could this be the medication?

## 2013-12-24 NOTE — Telephone Encounter (Signed)
I am in [place] all week.  Please follow up

## 2013-12-25 NOTE — Telephone Encounter (Signed)
Reviewed with Dr. Ladona Ridgelaylor. He advised the following:  1.  She is to remain off Xarelto for 1 week. 2. If rash resolves then she may start Savaysa 60mg  every day. 3. If  rash does not resolve, then she needs to see an allergist or start Nystatin.  Called patient and reviewed above recommendations. She states that the rash seems to be gone. Patient was instructed to call triage nurse on 8/21 with update on her condition. If she starts Savaysa she will send her husband to pick up samples.

## 2013-12-27 ENCOUNTER — Other Ambulatory Visit: Payer: Self-pay | Admitting: *Deleted

## 2013-12-27 MED ORDER — SIMVASTATIN 40 MG PO TABS: ORAL_TABLET | ORAL | Status: DC

## 2013-12-28 ENCOUNTER — Telehealth: Payer: Self-pay | Admitting: Internal Medicine

## 2013-12-28 NOTE — Telephone Encounter (Signed)
Left message for pt to call back  °

## 2013-12-28 NOTE — Telephone Encounter (Signed)
Called to advise us that the rash and itching has completely gone. States Dr. Ladona Ridgelaylor was going to give her another medication if rash was gone.  According to notes Dr. Ladona Ridgelaylor was giving her Savaysa 60 mg daily.  Will give her 2 weeks of samples and a Rx card was given. She will call us back to let us know if she tolerates this medication so a Rx can be called into pharmacy. Samples left at front desk for husband to pick up-she is out of town. Will forward to Dr. Ladona Ridgelaylor and Anselm PancoastKelly Lanier,RN for FYI.

## 2013-12-28 NOTE — Telephone Encounter (Signed)
New message     Need to give nurse an update

## 2013-12-31 NOTE — Telephone Encounter (Signed)
Routing message to Dennis Bast, RN, Dr. Lubertha Basque primary nurse for f/u

## 2014-01-11 ENCOUNTER — Other Ambulatory Visit: Payer: Self-pay | Admitting: *Deleted

## 2014-01-11 MED ORDER — EDOXABAN TOSYLATE 60 MG PO TABS: 60.0000 mg | ORAL_TABLET | Freq: Every day | ORAL | Status: DC

## 2014-01-11 NOTE — Telephone Encounter (Signed)
Patient called and stated that she is tolerating the savaysa fine and would like an rx sent in.

## 2014-06-02 ENCOUNTER — Observation Stay (HOSPITAL_COMMUNITY)
Admission: EM | Admit: 2014-06-02 | Discharge: 2014-06-03 | Disposition: A | Discharge status: Home / Self Care | Payer: 59 | Attending: Internal Medicine | Admitting: Internal Medicine

## 2014-06-02 ENCOUNTER — Emergency Department (HOSPITAL_COMMUNITY): Payer: 59

## 2014-06-02 ENCOUNTER — Encounter (HOSPITAL_COMMUNITY): Payer: Self-pay | Admitting: *Deleted

## 2014-06-02 DIAGNOSIS — R079 Chest pain, unspecified: Secondary | ICD-10-CM | POA: Diagnosis present

## 2014-06-02 DIAGNOSIS — Z888 Allergy status to other drugs, medicaments and biological substances status: Secondary | ICD-10-CM | POA: Insufficient documentation

## 2014-06-02 DIAGNOSIS — E785 Hyperlipidemia, unspecified: Secondary | ICD-10-CM | POA: Diagnosis not present

## 2014-06-02 DIAGNOSIS — Z7901 Long term (current) use of anticoagulants: Secondary | ICD-10-CM | POA: Insufficient documentation

## 2014-06-02 DIAGNOSIS — I480 Paroxysmal atrial fibrillation: Secondary | ICD-10-CM | POA: Diagnosis not present

## 2014-06-02 DIAGNOSIS — Z6834 Body mass index (BMI) 34.0-34.9, adult: Secondary | ICD-10-CM | POA: Diagnosis not present

## 2014-06-02 DIAGNOSIS — M7989 Other specified soft tissue disorders: Secondary | ICD-10-CM | POA: Diagnosis not present

## 2014-06-02 DIAGNOSIS — Z91018 Allergy to other foods: Secondary | ICD-10-CM | POA: Diagnosis not present

## 2014-06-02 DIAGNOSIS — I10 Essential (primary) hypertension: Secondary | ICD-10-CM | POA: Diagnosis not present

## 2014-06-02 DIAGNOSIS — I4510 Unspecified right bundle-branch block: Secondary | ICD-10-CM | POA: Insufficient documentation

## 2014-06-02 DIAGNOSIS — M2550 Pain in unspecified joint: Secondary | ICD-10-CM | POA: Diagnosis not present

## 2014-06-02 DIAGNOSIS — I4892 Unspecified atrial flutter: Secondary | ICD-10-CM | POA: Insufficient documentation

## 2014-06-02 DIAGNOSIS — R001 Bradycardia, unspecified: Secondary | ICD-10-CM | POA: Insufficient documentation

## 2014-06-02 DIAGNOSIS — R609 Edema, unspecified: Secondary | ICD-10-CM

## 2014-06-02 DIAGNOSIS — E669 Obesity, unspecified: Secondary | ICD-10-CM | POA: Diagnosis not present

## 2014-06-02 DIAGNOSIS — M546 Pain in thoracic spine: Secondary | ICD-10-CM | POA: Insufficient documentation

## 2014-06-02 DIAGNOSIS — Z881 Allergy status to other antibiotic agents status: Secondary | ICD-10-CM | POA: Diagnosis not present

## 2014-06-02 HISTORY — DX: Paroxysmal atrial fibrillation: I48.0

## 2014-06-02 LAB — BASIC METABOLIC PANEL
Anion gap: 6 (ref 5–15)
BUN: 10 mg/dL (ref 6–23)
CO2: 26 mmol/L (ref 19–32)
Calcium: 8.6 mg/dL (ref 8.4–10.5)
Chloride: 106 mmol/L (ref 96–112)
Creatinine, Ser: 0.88 mg/dL (ref 0.50–1.10)
GFR calc Af Amer: 82 mL/min — ABNORMAL LOW (ref >90)
GFR calc non Af Amer: 71 mL/min — ABNORMAL LOW (ref >90)
GLUCOSE: 113 mg/dL — AB (ref 70–99)
Potassium: 3.9 mmol/L (ref 3.5–5.1)
Sodium: 138 mmol/L (ref 135–145)

## 2014-06-02 LAB — CBC
HCT: 33.8 % — ABNORMAL LOW (ref 36.0–46.0)
HEMOGLOBIN: 11.5 g/dL — AB (ref 12.0–15.0)
MCH: 31.3 pg (ref 26.0–34.0)
MCHC: 34 g/dL (ref 30.0–36.0)
MCV: 92.1 fL (ref 78.0–100.0)
PLATELETS: 156 10*3/uL (ref 150–400)
RBC: 3.67 MIL/uL — ABNORMAL LOW (ref 3.87–5.11)
RDW: 12.4 % (ref 11.5–15.5)
WBC: 4.7 10*3/uL (ref 4.0–10.5)

## 2014-06-02 LAB — I-STAT TROPONIN, ED: TROPONIN I, POC: 0.18 ng/mL — AB (ref 0.00–0.08)

## 2014-06-02 LAB — BRAIN NATRIURETIC PEPTIDE: B NATRIURETIC PEPTIDE 5: 155.1 pg/mL — AB (ref 0.0–100.0)

## 2014-06-02 LAB — TROPONIN I: TROPONIN I: 0.2 ng/mL — AB (ref <0.031)

## 2014-06-02 MED ORDER — PANTOPRAZOLE SODIUM 40 MG PO TBEC
40.0000 mg | DELAYED_RELEASE_TABLET | Freq: Every day | ORAL | Status: DC
  Administered 2014-06-03: 40 mg via ORAL
  Filled 2014-06-02: qty 1

## 2014-06-02 MED ORDER — METOPROLOL SUCCINATE ER 25 MG PO TB24
25.0000 mg | ORAL_TABLET | Freq: Every day | ORAL | Status: DC
  Administered 2014-06-03: 25 mg via ORAL
  Filled 2014-06-02: qty 1

## 2014-06-02 MED ORDER — ACETAMINOPHEN 325 MG PO TABS: 650.0000 mg | ORAL_TABLET | Freq: Two times a day (BID) | ORAL | Status: DC | PRN

## 2014-06-02 MED ORDER — FLECAINIDE ACETATE 50 MG PO TABS
150.0000 mg | ORAL_TABLET | Freq: Two times a day (BID) | ORAL | Status: DC
  Administered 2014-06-03 (×2): 150 mg via ORAL
  Filled 2014-06-02 (×3): qty 1

## 2014-06-02 MED ORDER — APIXABAN 5 MG PO TABS
5.0000 mg | ORAL_TABLET | Freq: Two times a day (BID) | ORAL | Status: DC
  Administered 2014-06-03 (×2): 5 mg via ORAL
  Filled 2014-06-02 (×3): qty 1

## 2014-06-02 MED ORDER — SIMVASTATIN 40 MG PO TABS
40.0000 mg | ORAL_TABLET | Freq: Every day | ORAL | Status: DC
  Administered 2014-06-03: 40 mg via ORAL
  Filled 2014-06-02 (×2): qty 1

## 2014-06-02 MED ORDER — LOSARTAN POTASSIUM 50 MG PO TABS
100.0000 mg | ORAL_TABLET | Freq: Every day | ORAL | Status: DC
  Administered 2014-06-03: 100 mg via ORAL
  Filled 2014-06-02: qty 2

## 2014-06-02 MED ORDER — ASPIRIN EC 81 MG PO TBEC
81.0000 mg | DELAYED_RELEASE_TABLET | Freq: Every day | ORAL | Status: DC
  Administered 2014-06-03: 81 mg via ORAL
  Filled 2014-06-02: qty 1

## 2014-06-02 MED ORDER — ONDANSETRON HCL 4 MG/2ML IJ SOLN: 4.0000 mg | Freq: Four times a day (QID) | INTRAMUSCULAR | Status: DC | PRN

## 2014-06-02 MED ORDER — SUCRALFATE 1 G PO TABS
1.0000 g | ORAL_TABLET | Freq: Three times a day (TID) | ORAL | Status: DC
  Administered 2014-06-03 (×2): 1 g via ORAL
  Filled 2014-06-02 (×5): qty 1

## 2014-06-02 MED ORDER — ACETAMINOPHEN 325 MG PO TABS
650.0000 mg | ORAL_TABLET | ORAL | Status: DC | PRN
  Administered 2014-06-02 – 2014-06-03 (×3): 650 mg via ORAL
  Filled 2014-06-02 (×3): qty 2

## 2014-06-02 MED ORDER — NITROGLYCERIN 0.4 MG SL SUBL: 0.4000 mg | SUBLINGUAL_TABLET | SUBLINGUAL | Status: DC | PRN

## 2014-06-02 NOTE — ED Notes (Signed)
Pt placed on monitor with 5 lead, bp cuff and pulse ox

## 2014-06-02 NOTE — ED Notes (Signed)
Report attempt x 1 

## 2014-06-02 NOTE — ED Provider Notes (Signed)
CSN: [id number]     Arrival date & time 06/02/14  1909 History   First MD Initiated Contact with Patient 06/02/14 2007     Chief Complaint  Patient presents with  . Chest Pain  . Back Pain  . Leg Swelling     (Consider location/radiation/quality/duration/timing/severity/associated sxs/prior Treatment) Patient is a 59 y.o. female presenting with chest pain.  Chest Pain Pain location:  Substernal area Pain quality: sharp   Pain radiates to the back: yes   Pain severity:  Moderate Onset quality:  Gradual Duration:  3 days Timing:  Constant Progression:  Unchanged Chronicity:  New Context comment:  Had ablation for afib 3 days ago Relieved by:  Nothing Worsened by:  Nothing tried Ineffective treatments:  None tried Associated symptoms: back pain   Associated symptoms: no cough, no fever and no shortness of breath     Past Medical History  Diagnosis Date  . Premature menopause   . Polyarthralgia   . HTN (hypertension)   . Hyperlipidemia   . DM mellitus, gestational   . Chicken pox   . Paroxysmal atrial fibrillation    Past Surgical History  Procedure Laterality Date  . Tonsilectomy, adenoidectomy, bilateral myringotomy and tubes    . Atrial fibrillation ablation     Family History  Problem Relation Age of Onset  . Stroke Father     TIA/ pacemker  . Cancer Father   . Coronary artery disease Father   . Coronary artery disease Mother   . Stroke Other     grandfather  . Diabetes Other     grandmother  . Stroke Sister   . Stroke Other     aunt  . Stroke Other     unlce   History  Substance Use Topics  . Smoking status: Never Smoker   . Smokeless tobacco: Never Used  . Alcohol Use: 0.0 oz/week    0 Not specified per week   OB History    No data available     Review of Systems  Constitutional: Negative for fever.  Respiratory: Negative for cough and shortness of breath.   Cardiovascular: Positive for chest pain.  Musculoskeletal: Positive for back  pain.  All other systems reviewed and are negative.     Allergies  Lisinopril; Onion; Diltiazem; Tetracycline; and Xarelto  Home Medications   Prior to Admission medications   Medication Sig Start Date End Date Taking? Authorizing Provider  acetaminophen (TYLENOL) 325 MG tablet Take 650 mg by mouth 2 (two) times daily as needed (pain).   Yes Historical Provider, MD  apixaban (ELIQUIS) 5 MG TABS tablet Take 5 mg by mouth 2 (two) times daily.   Yes Historical Provider, MD  cetirizine (ZYRTEC) 10 MG tablet Take 10 mg by mouth daily.   Yes Historical Provider, MD  flecainide (TAMBOCOR) 100 MG tablet Take 150 mg by mouth every 12 (twelve) hours.   Yes Historical Provider, MD  glycerin adult 2 G SUPP Place 1 suppository rectally once as needed for moderate constipation.   Yes Historical Provider, MD  losartan (COZAAR) 100 MG tablet Take 1 tablet (100 mg total) by mouth daily. 11/22/13  Yes Marinus Maw, MD  metoprolol succinate (TOPROL-XL) 25 MG 24 hr tablet Take 25 mg by mouth daily. 05/09/14  Yes Historical Provider, MD  Multiple Vitamins-Minerals (MULTIVITAMIN WITH MINERALS) tablet Take 1 tablet by mouth daily.   Yes Historical Provider, MD  omeprazole (PRILOSEC) 20 MG capsule Take 20 mg by mouth every 12 (twelve)  hours.  05/30/14  Yes Historical Provider, MD  simvastatin (ZOCOR) 40 MG tablet TAKE 1 TABLET (40 MG TOTAL) BY MOUTH EVERY EVENING. Patient taking differently: Take 40 mg by mouth at bedtime.  12/27/13  Yes Marinus Maw, MD  sucralfate (CARAFATE) 1 G tablet Take 1 g by mouth 4 (four) times daily -  with meals and at bedtime.  05/30/14  Yes Historical Provider, MD   BP 139/80 mmHg  Pulse 55  Temp(Src) 98.3 F (36.8 C) (Oral)  Resp 16  Ht  (1.702 m)  Wt 222 lb 3.6 oz (100.8 kg)  BMI 34.80 kg/m2  SpO2 99% Physical Exam  Constitutional: She is oriented to person, place, and time. She appears well-developed and well-nourished.  HENT:  Head: Normocephalic and atraumatic.   Right Ear: External ear normal.  Left Ear: External ear normal.  Eyes: Conjunctivae and EOM are normal. Pupils are equal, round, and reactive to light.  Neck: Normal range of motion. Neck supple.  Cardiovascular: Normal rate, regular rhythm, normal heart sounds and intact distal pulses.   Pulmonary/Chest: Effort normal and breath sounds normal.  Abdominal: Soft. Bowel sounds are normal. There is no tenderness.  Musculoskeletal: Normal range of motion.  Neurological: She is alert and oriented to person, place, and time.  Skin: Skin is warm and dry.  Vitals reviewed.   ED Course  Procedures (including critical care time) Labs Review Labs Reviewed  CBC - Abnormal; Notable for the following:    RBC 3.67 (*)    Hemoglobin 11.5 (*)    HCT 33.8 (*)    All other components within normal limits  BASIC METABOLIC PANEL - Abnormal; Notable for the following:    Glucose, Bld 113 (*)    GFR calc non Af Amer 71 (*)    GFR calc Af Amer 82 (*)    All other components within normal limits  BRAIN NATRIURETIC PEPTIDE - Abnormal; Notable for the following:    B Natriuretic Peptide 155.1 (*)    All other components within normal limits  TROPONIN I - Abnormal; Notable for the following:    Troponin I 0.20 (*)    All other components within normal limits  I-STAT TROPOININ, ED - Abnormal; Notable for the following:    Troponin i, poc 0.18 (*)    All other components within normal limits  TROPONIN I  TROPONIN I    Imaging Review Dg Chest 2 View  06/02/2014   CLINICAL DATA:  Patient states that she has cardiac ablation done X 4 days ago. patient states she has been having tightness in her chest X 4 days ago that is located in the midsternum area. Patient has also been having dizziness X 4 days. Patient states that she has also had a cough and SOB X 1 day. Patient has a hx of HTN. Patient is a nonsmoker.  EXAM: CHEST  2 VIEW  COMPARISON:  04/22/2008  FINDINGS: Lung base opacity noted most likely  atelectasis. No evidence of pulmonary edema. No pleural effusion or pneumothorax. Elevation of the right hemidiaphragm accentuate compared to the prior study.  Cardiac silhouette is normal in size and configuration. No mediastinal or hilar masses or evidence of adenopathy.  Bony thorax is demineralized but intact.  IMPRESSION: 1. Lung base opacity most likely atelectasis. No pulmonary edema. No pleural effusion or pneumothorax.   Electronically Signed   By: Amie Portland M.D.   On: 06/02/2014 19:51     EKG Interpretation   Date/Time:  Sunday June 02 2014 19:16:47 EST Ventricular Rate:  57 PR Interval:  192 QRS Duration: 98 QT Interval:  426 QTC Calculation: 414 R Axis:   67 Text Interpretation:  Sinus bradycardia Low voltage QRS Incomplete right  bundle branch block Cannot rule out Anterior infarct , age undetermined  Abnormal ECG No significant change since last tracing Confirmed by Mirian MoGentry,  Casten Floren [phone]([id number]) on 06/02/2014 8:19:27 PM      MDM   Final diagnoses:  Chest pain  Paroxysmal atrial fibrillation    59 y.o. female with pertinent PMH of recent ablation for afib presents with chest pain radiating to back and leg swelling over last 4 days [facility] Dr[facility] Lovell SheehanJenkins with [facility].  No fevers, however has been dyspneic.  Physical exam on arrival as above.  ECG as above.  Initial trop positive.  Consulted cardiology who admitted the patient.    I have reviewed all laboratory and imaging studies if ordered as above  1. Paroxysmal atrial fibrillation   2. Chest pain         Mirian MoMatthew Abdiel Blackerby, MD 06/03/14 [id number]

## 2014-06-02 NOTE — H&P (Signed)
History and Physical   Admit date: 06/02/2014 Name:  [name] Medical record number: [id number] DOB/Age:  1955/10/01  59 y.o. female  [facility]  Chief complaint/reason for admission: Back pain and edema  HPI:  This 59 year old female came to the emergency room this evening complaining of back pain, general malaise and fatigue and some edema.  She has a history of paroxysmal atrial fibrillation and currently works in [place], although she maintains a residence here while she has currently trying to sell her home in anticipation of moving to [place].  She formally has seen Dr. Ladona Ridgelaylor for paroxysmal atrial fibrillation that was controlled on flecainide.  Since she last saw him she had increasing frequency and severity of atrial fibrillation requiring escalation of her flecainide dose and then developed atrial flutter requiring cardioversion and then with recurrence after that.  She subsequently underwent ablation by Dr. Janee Mornhompson in [place] on Thursday 3 days ago.  She felt well the next day but then began to have mid back pain, was feeling bad with malaise and fatigue and then noticed some edema.  She called their office and was advised to come to the emergency room.  She does not have any ischemic sounding chest pain.  She has not had recurrence of atrial fibrillation.  Troponin was mildly elevated on arrival here at 0.18.  EKG showed some low voltage.  She has been changed to Eliquis since she was last seen here and has been maintained on Eliquis since her ablation.    Past Medical History  Diagnosis Date  . Premature menopause   . Polyarthralgia   . HTN (hypertension)   . Hyperlipidemia   . DM mellitus, gestational   . Chicken pox   . Paroxysmal atrial fibrillation      Past Surgical History  Procedure Laterality Date  . Tonsilectomy, adenoidectomy, bilateral myringotomy and tubes    . Atrial fibrillation ablation     Allergies: is  allergic to lisinopril; onion; diltiazem; tetracycline; and xarelto.   Medications: Prior to Admission medications   Medication Sig Start Date End Date Taking? Authorizing Provider  acetaminophen (TYLENOL) 325 MG tablet Take 650 mg by mouth 2 (two) times daily as needed (pain).   Yes Historical Provider, MD  apixaban (ELIQUIS) 5 MG TABS tablet Take 5 mg by mouth 2 (two) times daily.   Yes Historical Provider, MD  cetirizine (ZYRTEC) 10 MG tablet Take 10 mg by mouth daily.   Yes Historical Provider, MD  flecainide (TAMBOCOR) 100 MG tablet Take 150 mg by mouth every 12 (twelve) hours.   Yes Historical Provider, MD  glycerin adult 2 G SUPP Place 1 suppository rectally once as needed for moderate constipation.   Yes Historical Provider, MD  losartan (COZAAR) 100 MG tablet Take 1 tablet (100 mg total) by mouth daily. 11/22/13  Yes Marinus MawGregg W Taylor, MD  metoprolol succinate (TOPROL-XL) 25 MG 24 hr tablet Take 25 mg by mouth daily. 05/09/14  Yes Historical Provider, MD  Multiple Vitamins-Minerals (MULTIVITAMIN WITH MINERALS) tablet Take 1 tablet by mouth daily.   Yes Historical Provider, MD  omeprazole (PRILOSEC) 20 MG capsule Take 20 mg by mouth every 12 (twelve) hours.  05/30/14  Yes Historical Provider, MD  simvastatin (ZOCOR) 40 MG tablet TAKE 1 TABLET (40 MG TOTAL) BY MOUTH EVERY EVENING. Patient taking differently: Take 40 mg by mouth at bedtime.  12/27/13  Yes Marinus MawGregg W Taylor, MD  sucralfate (CARAFATE) 1 G tablet Take 1 g  by mouth 4 (four) times daily -  with meals and at bedtime.  05/30/14  Yes Historical Provider, MD    Family History:  No family status information on file.   Social History:   reports that she has never smoked. She has never used smokeless tobacco. She reports that she drinks alcohol. She reports that she does not use illicit drugs.   History   Social History Narrative   currently lives in [place] and is trying to relocate there   Review of Systems: Other than as noted  above, the remainder of the review of systems is normal  Physical Exam: BP 132/70 mmHg  Pulse 55  Temp(Src) 97.7 F (36.5 C) (Oral)  Resp 16  Ht  (1.702 m)  Wt 96.163 kg (212 lb)  BMI 33.20 kg/m2  SpO2 98%  General appearance: Pleasant, soft-spoken, mildly obese white female in no acute distress Head: Normocephalic, without obvious abnormality, atraumatic Neck: no adenopathy, no carotid bruit, no JVD and supple, symmetrical, trachea midline Lungs: clear to auscultation bilaterally Heart: regular rate and rhythm, S1, S2 normal, no murmur, click, rub or gallop Abdomen: soft, non-tender; bowel sounds normal; no masses,  no organomegaly Pelvic: deferred Extremities: No edema or tenderness noted Pulses: 2+ and symmetric Neurologic: Grossly normal  Labs: CBC  Recent Labs  06/02/14 1923  WBC 4.7  RBC 3.67*  HGB 11.5*  HCT 33.8*  PLT 156  MCV 92.1  MCH 31.3  MCHC 34.0  RDW 12.4   CMP   Recent Labs  06/02/14 1923  NA 138  K 3.9  CL 106  CO2 26  GLUCOSE 113*  BUN 10  CREATININE 0.88  CALCIUM 8.6  GFRNONAA 71*  GFRAA 82*   Cardiac Panel (last 3 results) Troponin (Point of Care Test)  Recent Labs  06/02/14 1934  TROPIPOC 0.18*   EKG: Sinus rhythm, low voltage in the chest leads, incomplete right bundle-branch block  Radiology: Atelectasis in the bases   IMPRESSIONS: 1.  Back pain and mild edema following ablation for atrial fibrillation and atrial flutter 3 days ago  2.  Mild elevation of troponin that may be secondary to ablation. 3.  Long-term anticoagulation with Eliquis 4.  Hypertension. 5.  Mild obesity  PLAN:  Trend troponins at this time.  The mild elevation could be due to the recent ablation.  Check echocardiogram to be sure she does not have a pericardial effusion in light of the recent ablation in the low voltage on EKG.  Evaluate edema with lower extremity Dopplers.  Possible early discharge.  Follow-up EKG in the  morning.  Signed:  Darden Palmer MD [facility]  06/02/2014, 9:28 PM

## 2014-06-02 NOTE — ED Notes (Signed)
Pt reports pin point pain in upper back under shoulder blades starting yesterday. Pt also reports leg swelling and shortness of breath. Pt states she is unable to take deep breath. Pt had an ablation done on Thursday in [place]. Pt reports intermittent heaviness and pressure in chest.

## 2014-06-02 NOTE — ED Notes (Signed)
Elevated troponin of .18 reported to Dr. Micheline Mazeocherty on Pod A and Roxy CedarEmily Bivens-Nurse first

## 2014-06-02 NOTE — ED Notes (Signed)
MD Littie DeedsGentry notified of patient troponin level upon her arrival to room

## 2014-06-03 DIAGNOSIS — I480 Paroxysmal atrial fibrillation: Principal | ICD-10-CM

## 2014-06-03 DIAGNOSIS — I4891 Unspecified atrial fibrillation: Secondary | ICD-10-CM

## 2014-06-03 DIAGNOSIS — M7989 Other specified soft tissue disorders: Secondary | ICD-10-CM

## 2014-06-03 DIAGNOSIS — M546 Pain in thoracic spine: Secondary | ICD-10-CM

## 2014-06-03 LAB — TROPONIN I
TROPONIN I: 0.19 ng/mL — AB (ref <0.031)
TROPONIN I: 0.17 ng/mL — AB (ref <0.031)

## 2014-06-03 NOTE — Progress Notes (Signed)
DC IV and tele per MD orders; DC instructions reviewed with patient and husband at bedside; no further questions from patient or husband; pt will follow up with MD in [place].  Hermina BartersBOWMAN, Exander Shaul M, RN

## 2014-06-03 NOTE — Progress Notes (Signed)
Patient Profile: 59 y/o female, seen by by Dr. Ladona Ridgelaylor in the past, with a history of PAF s/p Afib ablation 4 days ago, 05/30/14, [place]. Admitted 1/24 for evaluation of back pain with elevated troponin and lower extremity edema.    Subjective: Back pain significantly improved since last night but still present ~4/10 (8/10 on admit). Right femoral access site still sore. Bilateral legs feel "achey". Denies CP and dyspnea.    Objective: Vital signs in last 24 hours: Temp:  [97.7 F (36.5 C)-98.7 F (37.1 C)] 98.2 F (36.8 C) (01/25 0612) Pulse Rate:  [50-58] 50 (01/25 0612) Resp:  [8-20] 12 (01/25 0612) BP: (131-164)/(62-84) 131/69 mmHg (01/25 0612) SpO2:  [97 %-100 %] 97 % (01/25 0612) Weight:  [212 lb (96.163 kg)-222 lb 3.6 oz (100.8 kg)] 222 lb 3.6 oz (100.8 kg) (01/24 2218) Last BM Date: 05/29/14 (preop ablation on 05/30/2014)  Intake/Output from previous day:   Intake/Output this shift:    Medications Current Facility-Administered Medications  Medication Dose Route Frequency Provider Last Rate Last Dose  . acetaminophen (TYLENOL) tablet 650 mg  650 mg Oral Q4H PRN Othella BoyerWilliam S Tilley, MD   650 mg at 06/03/14 0709  . apixaban (ELIQUIS) tablet 5 mg  5 mg Oral BID Othella BoyerWilliam S Tilley, MD   5 mg at 06/03/14 0039  . aspirin EC tablet 81 mg  81 mg Oral Daily Othella BoyerWilliam S Tilley, MD      . flecainide [facility](TAMBOCOR) tablet 150 mg  150 mg Oral Q12H Othella BoyerWilliam S Tilley, MD   150 mg at 06/03/14 0038  . losartan (COZAAR) tablet 100 mg  100 mg Oral Daily Othella BoyerWilliam S Tilley, MD      . metoprolol succinate (TOPROL-XL) 24 hr tablet 25 mg  25 mg Oral Daily Othella BoyerWilliam S Tilley, MD      . nitroGLYCERIN (NITROSTAT) SL tablet 0.4 mg  0.4 mg Sublingual Q5 Min x 3 PRN Othella BoyerWilliam S Tilley, MD      . ondansetron [facility](ZOFRAN) injection 4 mg  4 mg Intravenous Q6H PRN Othella BoyerWilliam S Tilley, MD      . pantoprazole (PROTONIX) EC tablet 40 mg  40 mg Oral Daily Othella BoyerWilliam S Tilley, MD      . simvastatin (ZOCOR) tablet 40 mg  40 mg Oral  QHS Othella BoyerWilliam S Tilley, MD   40 mg at 06/03/14 0039  . sucralfate (CARAFATE) tablet 1 g  1 g Oral TID WC & HS Othella BoyerWilliam S Tilley, MD        PE: General appearance: alert, cooperative and no distress Neck: no carotid bruit and no JVD Lungs: decreased BS bilaterally at the bases Heart: regular rate and rhythm, S1, S2 normal, no murmur, click, rub or gallop Extremities: no LEE Pulses: 2+ and symmetric Skin: warm and dry Neurologic: Grossly normal  Lab Results:   Recent Labs  06/02/14 1923  WBC 4.7  HGB 11.5*  HCT 33.8*  PLT 156   BMET  Recent Labs  06/02/14 1923  NA 138  K 3.9  CL 106  CO2 26  GLUCOSE 113*  BUN 10  CREATININE 0.88  CALCIUM 8.6   PT/INR No results for input(s): LABPROT, INR in the last 72 hours. Cholesterol No results for input(s): CHOL in the last 72 hours. Cardiac Panel (last 3 results)  Recent Labs  06/02/14 2310 06/03/14 0445  TROPONINI 0.20* 0.19*    Studies/Results:  2D echo - pending   Bilateral LE Dopplers- pending   Assessment/Plan  Active Problems:  Chest pain  1. Back Pain: improved since last PM. Likely musculoskeletal as pain is reproducible with palpation between shoulder blades. Troponin was only minimally elevated and trending downward. No associated CP or dyspnea. Likely noncardiac.  2. Elevated Troponin: minimally elevated and trending downward 0.18-->0.20-->0.19. No associated CP. Elevation likely subsequent to recent afib ablation procedure 05/30/14.  No Sx of angina.  3. S/P Afib ablation: post procedure day 4. EKG on admit revealed low voltage QRS complexes. 2D echo pending to r/o effusion. She denies CP, dyspnea and syncope/near syncope.   4. PAF: maintaining NSR post ablation. Continue Eliquis. On low dose Toprol - HR has been in 50s.  5. LEE: subjective edema. No significant findings on exam. However, bilateral LE doppler studies pending to r/o DVT.      LOS: 1 day    Brittainy M. Delmer Islam 06/03/2014 7:42 AM  I have seen, examined and evaluated the patient this AM along with Ms. Sharol Harness, PA-C.  After reviewing all the available data and chart,  I agree with her findings, examination as well as impression recommendations.  Mid back pain reproducible on exam - clearly MSK related.  Echo performed - does appear to have a small posterior effusion - likely benign.  Will await formal read.  Continue current cardiac meds (on low dose BB with Flecainide) On Eliquis - no bleeding  On statin.  Would Rx pain with analgesics - prefer Tylenol to NSAID as she is on Eliquis.  Provided no concern on Echo read, anticipate d/c this PM   HARDING, Piedad Climes, M.D., M.S. Interventional Cardiologist   Pager # [phone]

## 2014-06-03 NOTE — Progress Notes (Signed)
UR completed 

## 2014-06-03 NOTE — Progress Notes (Signed)
*  PRELIMINARY RESULTS* Echocardiogram 2D Echocardiogram has been performed.  Jeryl ColumbiaLLIOTT, Keyasia Jolliff 06/03/2014, 9:39 AM

## 2014-06-03 NOTE — Progress Notes (Signed)
*  PRELIMINARY RESULTS* Vascular Ultrasound Lower extremity venous duplex has been completed.  Preliminary findings: no evidence of DVT.   Farrel DemarkJill Eunice, RDMS, RVT  06/03/2014, 1:14 PM

## 2014-06-03 NOTE — Discharge Summary (Signed)
Physician Discharge Summary  Patient ID: [name] MRN: [id number] DOB/AGE: 60/11/1955 60 y.o.   Primary Cardiologist: Previously Dr. Ladona Ridgel; Now followed by Dr. Janee Morn in [place].   Admit date: 06/02/2014 Discharge date: 06/03/2014  Admission Diagnoses: Back Pain and LEE  Discharge Diagnoses:  Active Problems:   Musculoskeletal back pain, thoracic   Discharged Condition: stable  Hospital Course: 59 y/o female, seen by Dr. Ladona Ridgel in the past, with a history of PAF on Flecainide and Eliquis. She recently underwent Afib ablation on 05/30/14 by Dr. Janee Morn in [place], where she resides, for recurrent atrial fibrillation. She was in [place] for work when she developed severe 8/10 upper back pain between her shoulder blades. She also noted subjective bilateral LEE. She contacted the office of Dr. Janee Morn and was instructed to seek prompt medical attention, prompting her to report to the [facility]. On arrival, troponin was mildly elevated at 0.20. She denied CP. EKG demonstrated NSR but also low voltage QRS complexes. Given her back pain and abnormal EKG after recent afib ablation, the decision was made to admit for further observation. Cardiac enzymes were recycled and showed serial decreases from 0.20-->0.19 --> 0.17. It was felt that her troponin elevation was likely subsequent to recent afib ablation. In addition, she had reproducible back pain with palpation on physical exam and she noted significant spontaneous improvement overnight. Thus, it was felt that her back pain was likely musculoskeletal and noncardiac. PRN Tylenol was prescribed. A 2D echo was also obtained to r/o effusion given her abnormal EKG. This revealed normal systolic function with EF of 50% w/o WMA. She was noted to have a small fat pad vs a small pericardial effusion along the RV free wall near the base of the heart. However, there was no large pericardial effusion noted. Also ordered were bilateral LE dopplers  to r/o DVT given her edema. This was negative for DVT. No further w/u was indicated. She was last seen and examined by Dr. Herbie Baltimore, who determined she was stable for discharge home. As mentioned above, PRN Tylenol was recommended for her back pain. All other medications were continued as previously prescribed. She was advised to continue f/u with Dr. Janee Morn in [place].   Consults: None  Significant Diagnostic Studies:   2D echo 06/04/14 Study Conclusions  - Left ventricle: Technically limited study. The cavity size was normal. Wall thickness was increased in a pattern of mild LVH. The estimated ejection fraction was 50%. Regional wall motion abnormalities cannot be excluded. - Left atrium: The atrium was mildly dilated. - Right ventricle: The cavity size was normal. Systolic function was mildly reduced. - Pulmonary arteries: PA peak pressure: 43 mm Hg (S). - Systemic veins: Poorly visualized. - Pericardium, extracardiac: There is either a fat pad or a small pericardial effusion along the RV free wall near the base of the heart. There is no large pericardial effusion.   Treatments: See Hospital Course  Discharge Exam: Blood pressure 134/66, pulse 55, temperature 98 F (36.7 C), temperature source Oral, resp. rate 18, height  (1.702 m), weight 222 lb 3.6 oz (100.8 kg), SpO2 97 %.   Disposition: 01-Home or Self Care      Discharge Instructions    Diet - low sodium heart healthy    Complete by:  As directed      Increase activity slowly    Complete by:  As directed             Medication List    TAKE these  medications        acetaminophen 325 MG tablet  Commonly known as:  TYLENOL  Take 650 mg by mouth 2 (two) times daily as needed (pain).     apixaban 5 MG Tabs tablet  Commonly known as:  ELIQUIS  Take 5 mg by mouth 2 (two) times daily.     cetirizine 10 MG tablet  Commonly known as:  ZYRTEC  Take 10 mg by mouth daily.     flecainide 100 MG  tablet  Commonly known as:  TAMBOCOR  Take 150 mg by mouth every 12 (twelve) hours.     glycerin adult 2 G Supp  Place 1 suppository rectally once as needed for moderate constipation.     losartan 100 MG tablet  Commonly known as:  COZAAR  Take 1 tablet (100 mg total) by mouth daily.     metoprolol succinate 25 MG 24 hr tablet  Commonly known as:  TOPROL-XL  Take 25 mg by mouth daily.     multivitamin with minerals tablet  Take 1 tablet by mouth daily.     omeprazole 20 MG capsule  Commonly known as:  PRILOSEC  Take 20 mg by mouth every 12 (twelve) hours.     simvastatin 40 MG tablet  Commonly known as:  ZOCOR  TAKE 1 TABLET (40 MG TOTAL) BY MOUTH EVERY EVENING.     sucralfate 1 G tablet  Commonly known as:  CARAFATE  Take 1 g by mouth 4 (four) times daily -  with meals and at bedtime.       Follow-up Information    Follow up with Dr. Janee Mornhompson.   Why:  follow-up with Dr. Janee Mornhompson in [place].       TIME SPENT ON DISCHARGE, INCLUDING PHYSICIAN TIME: > 30 MINUTES  Signed: SIMMONS, BRITTAINY 06/03/2014, 4:59 PM

## 2014-07-03 ENCOUNTER — Other Ambulatory Visit: Payer: Self-pay | Admitting: Internal Medicine

## 2016-07-13 IMAGING — CR DG CHEST 2V
2 series · 2 of 2 positions shown · non-contrast
Comparison: 04/22/2008

CLINICAL DATA: Patient states that she has cardiac ablation done X
4 days ago. patient states she has been having tightness in her
chest X 4 days ago that is located in the midsternum area. Patient
has also been having dizziness X 4 days. Patient states that she has
also had a cough and SOB X 1 day. Patient has a hx of HTN. Patient
is a nonsmoker.

EXAM:
CHEST  2 VIEW

[chest pa]
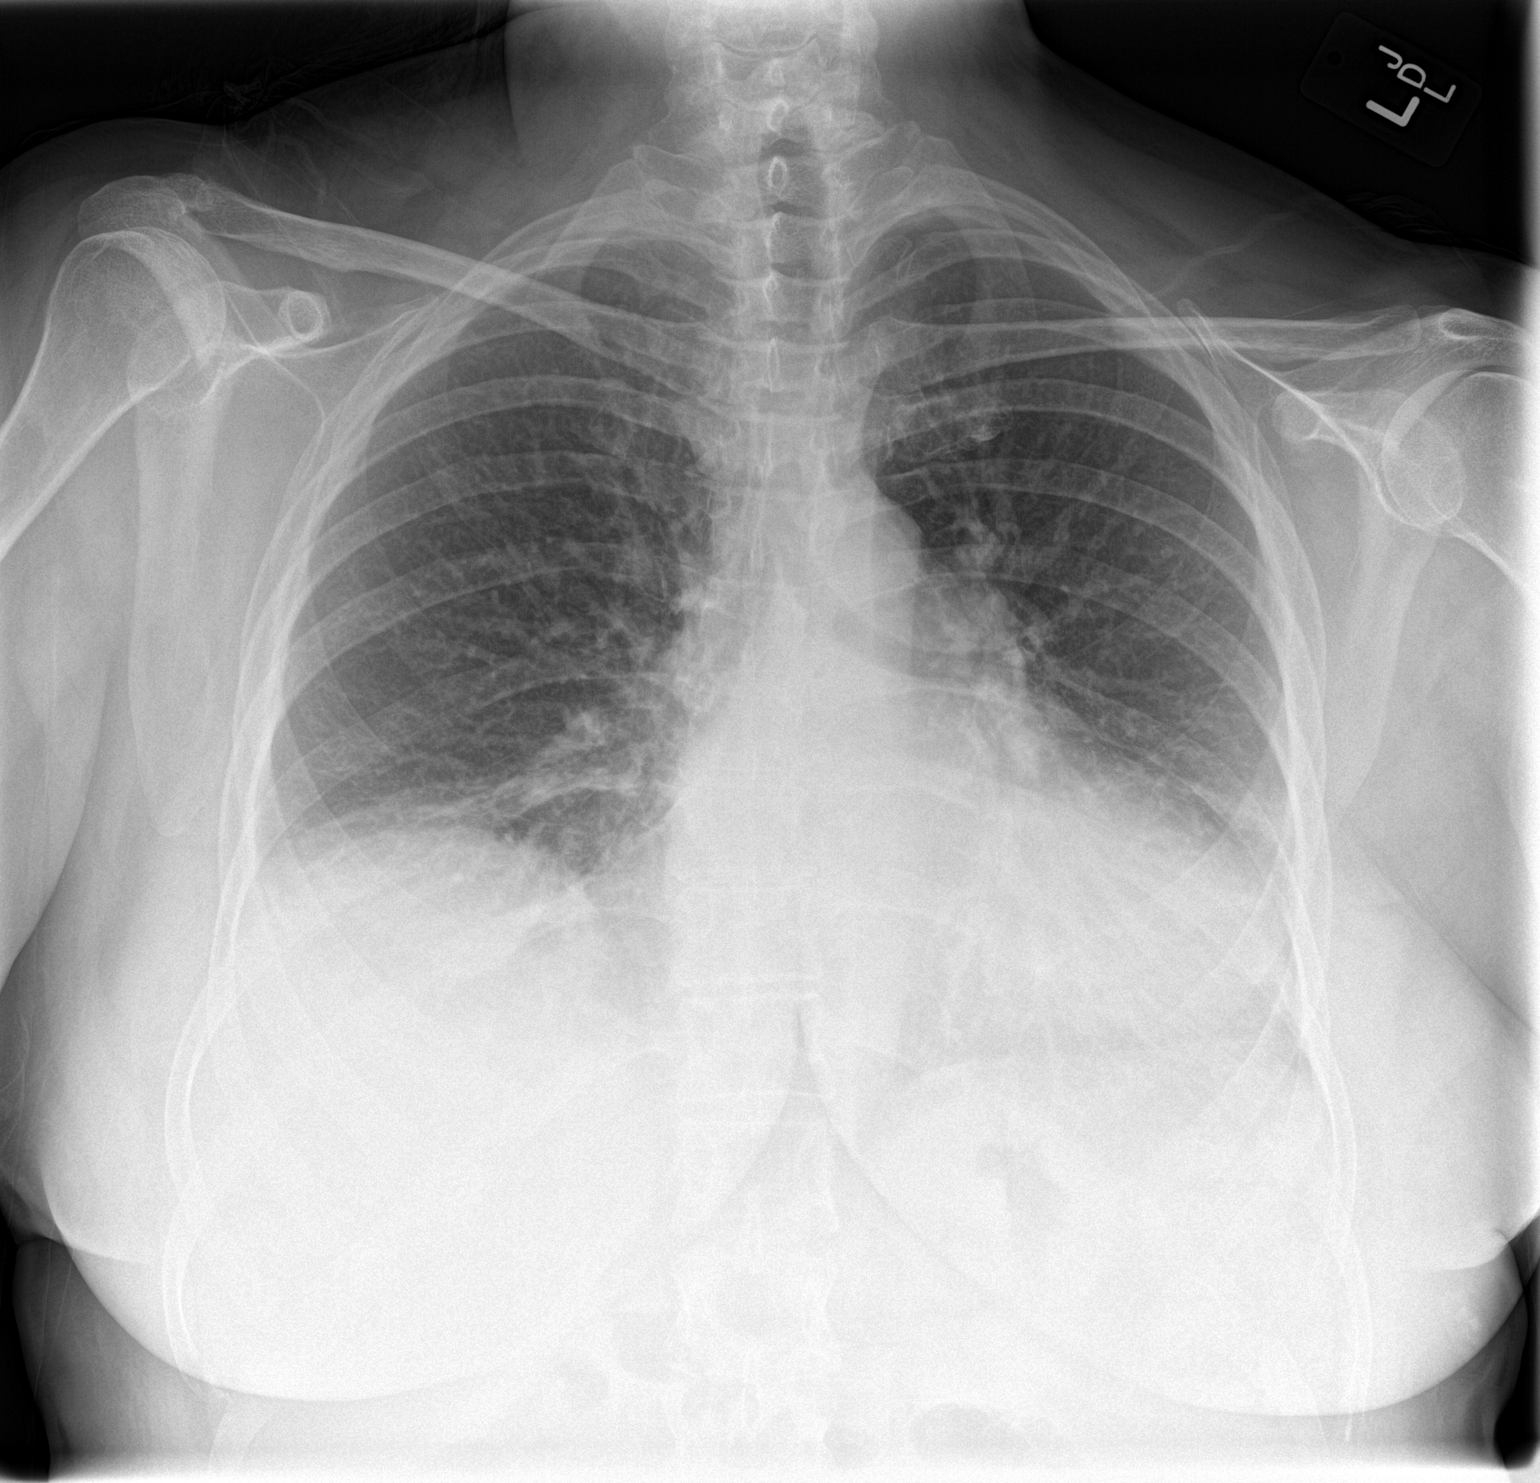

[chest lat]
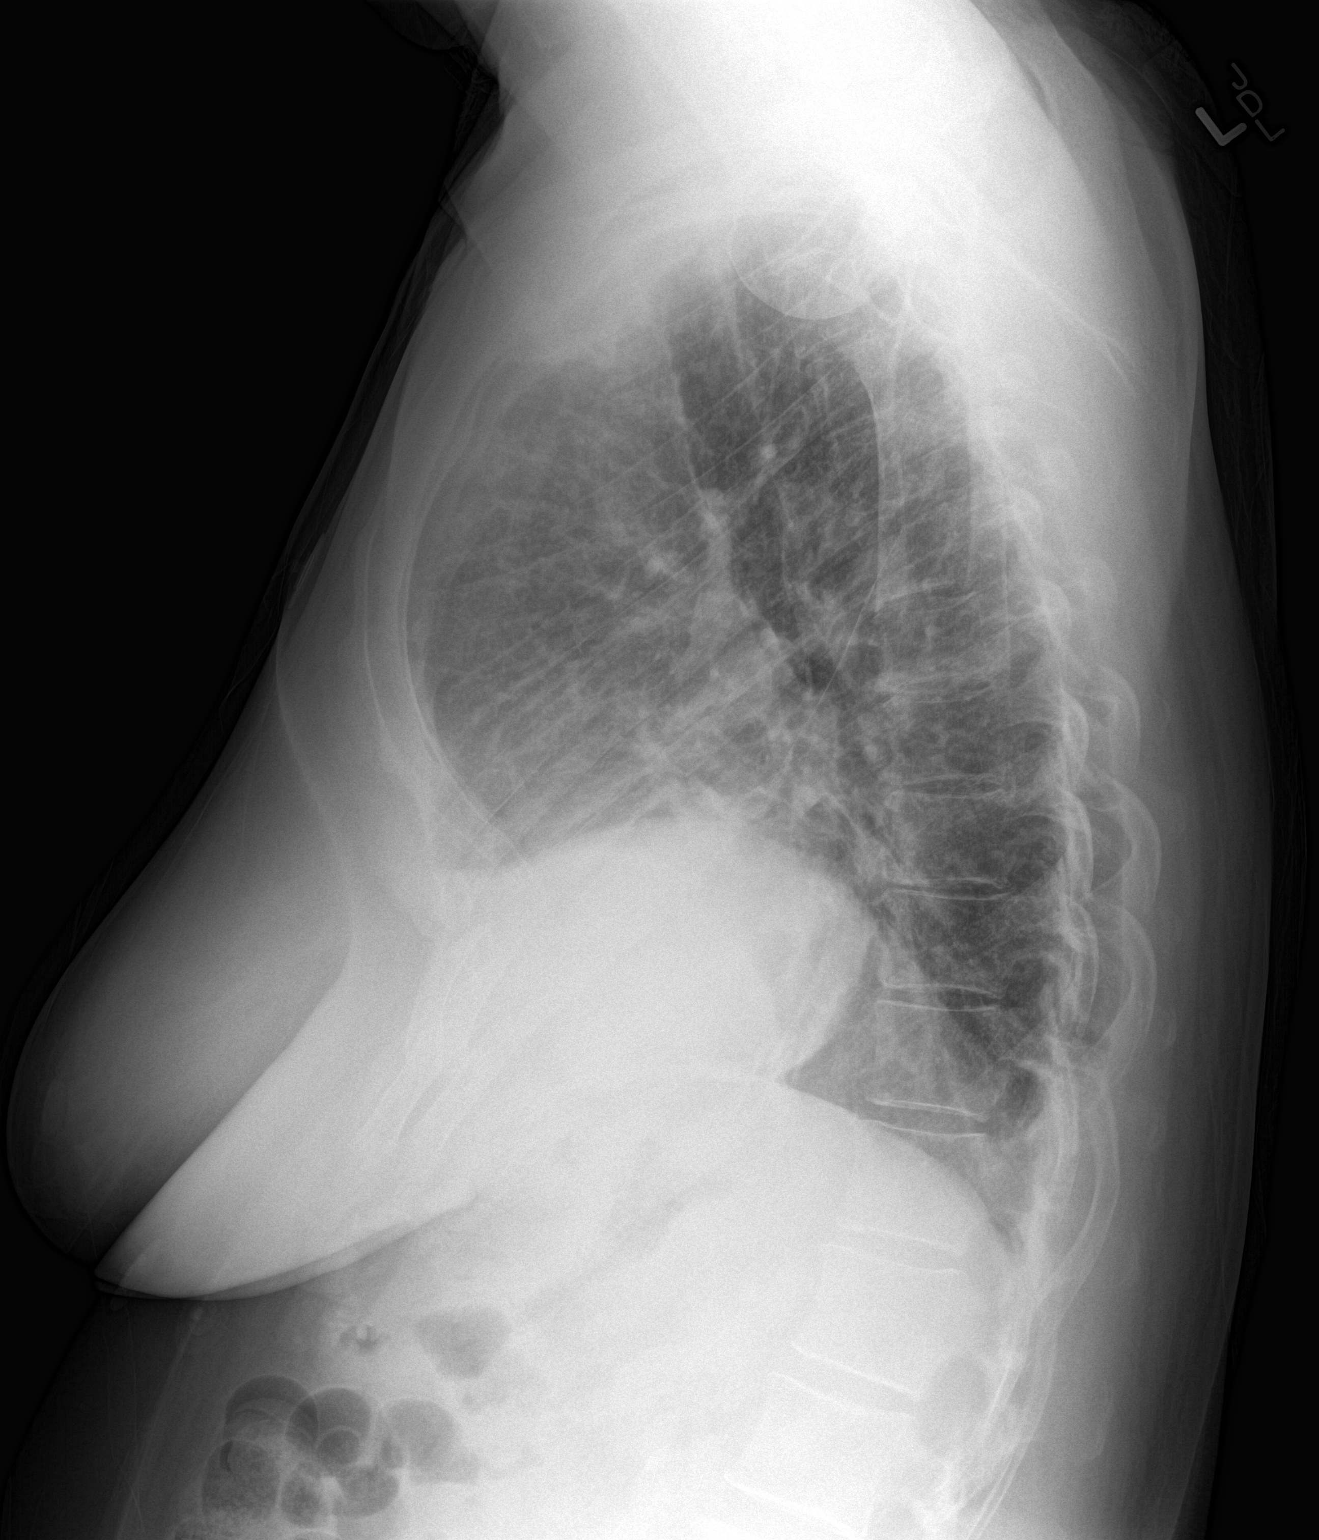

[2 of 2 positions shown; findings below may reference images not displayed]

FINDINGS: Lung base opacity noted most likely atelectasis. No evidence of
pulmonary edema. No pleural effusion or pneumothorax. Elevation of
the right hemidiaphragm accentuate compared to the prior study.

Cardiac silhouette is normal in size and configuration. No
mediastinal or hilar masses or evidence of adenopathy.

Bony thorax is demineralized but intact.
IMPRESSION: 1. Lung base opacity most likely atelectasis. No pulmonary edema. No
pleural effusion or pneumothorax.

## 2017-08-19 ENCOUNTER — Encounter: Payer: Self-pay | Admitting: Podiatry

## 2017-08-19 ENCOUNTER — Ambulatory Visit (INDEPENDENT_AMBULATORY_CARE_PROVIDER_SITE_OTHER): Payer: BLUE CROSS/BLUE SHIELD

## 2017-08-19 ENCOUNTER — Ambulatory Visit: Payer: BLUE CROSS/BLUE SHIELD | Admitting: Podiatry

## 2017-08-19 VITALS — BP 121/69 | HR 50 | Resp 18

## 2017-08-19 DIAGNOSIS — S92501A Displaced unspecified fracture of right lesser toe(s), initial encounter for closed fracture: Secondary | ICD-10-CM | POA: Diagnosis not present

## 2017-08-19 DIAGNOSIS — T1490XA Injury, unspecified, initial encounter: Secondary | ICD-10-CM | POA: Diagnosis not present

## 2017-08-19 NOTE — Progress Notes (Signed)
Subjective:    Patient ID: [name], female    DOB: 1955-07-07, 62 y.o.   MRN: [id number]  HPI 62 year old female presents the office today for concerns of pain to the right second, third toe.  1 week ago she was carrying a shelf and she dropped a piece of wood on her toes and she reported immediate onset of pain.  However she still having some swelling as well as some burning to the tips of the toes mostly her second toe and because that she presents today for evaluation.  She said no recent treatment.  She does have difficulty wearing shoe to that foot.   Review of Systems  All other systems reviewed and are negative.  Past Medical History:  Diagnosis Date  . Chicken pox   . DM mellitus, gestational   . HTN (hypertension)   . Hyperlipidemia   . Paroxysmal atrial fibrillation (HCC)   . Polyarthralgia   . Premature menopause     Past Surgical History:  Procedure Laterality Date  . ATRIAL FIBRILLATION ABLATION    . TONSILECTOMY, ADENOIDECTOMY, BILATERAL MYRINGOTOMY AND TUBES       Current Outpatient Medications:  .  aspirin EC 81 MG tablet, Take 81 mg by mouth daily., Disp: , Rfl:  .  Cholecalciferol (VITAMIN D3 PO), Take by mouth., Disp: , Rfl:  .  Coenzyme Q10 (CO Q10 PO), Take by mouth., Disp: , Rfl:  .  hydrochlorothiazide (MICROZIDE) 12.5 MG capsule, Take 12.5 mg by mouth daily., Disp: , Rfl:  .  acetaminophen (TYLENOL) 325 MG tablet, Take 650 mg by mouth 2 (two) times daily as needed (pain)., Disp: , Rfl:  .  apixaban (ELIQUIS) 5 MG TABS tablet, Take 5 mg by mouth 2 (two) times daily., Disp: , Rfl:  .  cetirizine (ZYRTEC) 10 MG tablet, Take 10 mg by mouth daily., Disp: , Rfl:  .  flecainide (TAMBOCOR) 100 MG tablet, Take 150 mg by mouth every 12 (twelve) hours., Disp: , Rfl:  .  glycerin adult 2 G SUPP, Place 1 suppository rectally once as needed for moderate constipation., Disp: , Rfl:  .  losartan (COZAAR) 100 MG tablet, TAKE 1 TABLET BY MOUTH EVERY DAY,  Disp: 30 tablet, Rfl: 3 .  metoprolol succinate (TOPROL-XL) 25 MG 24 hr tablet, Take 25 mg by mouth daily., Disp: , Rfl: 6 .  Multiple Vitamins-Minerals (MULTIVITAMIN WITH MINERALS) tablet, Take 1 tablet by mouth daily., Disp: , Rfl:  .  omeprazole (PRILOSEC) 20 MG capsule, Take 20 mg by mouth every 12 (twelve) hours. , Disp: , Rfl:  .  simvastatin (ZOCOR) 40 MG tablet, TAKE 1 TABLET (40 MG TOTAL) BY MOUTH EVERY EVENING., Disp: 30 tablet, Rfl: 3 .  sucralfate (CARAFATE) 1 G tablet, Take 1 g by mouth 4 (four) times daily -  with meals and at bedtime. , Disp: , Rfl:   Allergies  Allergen Reactions  . Lisinopril Other (See Comments)    Dry cough  . Onion Other (See Comments)    Blisters in throat and mouth  . Diltiazem Rash  . Tetracycline Rash  . Xarelto [Rivaroxaban] Rash    Social History   Socioeconomic History  . Marital status: Married    Spouse name: Not on file  . Number of children: 2  . Years of education: Not on file  . Highest education level: Not on file  Occupational History  . Occupation: Dept. Production designer, [employer]    Employer: OTHER  Social Needs  .  Financial resource strain: Not on file  . Food insecurity:    Worry: Not on file    Inability: Not on file  . Transportation needs:    Medical: Not on file    Non-medical: Not on file  Tobacco Use  . Smoking status: Never Smoker  . Smokeless tobacco: Never Used  Substance and Sexual Activity  . Alcohol use: Yes    Alcohol/week: 0.0 oz  . Drug use: No  . Sexual activity: Not on file  Lifestyle  . Physical activity:    Days per week: Not on file    Minutes per session: Not on file  . Stress: Not on file  Relationships  . Social connections:    Talks on phone: Not on file    Gets together: Not on file    Attends religious service: Not on file    Active member of club or organization: Not on file    Attends meetings of clubs or organizations: Not on file    Relationship status: Not on file  . Intimate  partner violence:    Fear of current or ex partner: Not on file    Emotionally abused: Not on file    Physically abused: Not on file    Forced sexual activity: Not on file  Other Topics Concern  . Not on file  Social History Narrative  . Not on file         Objective:   Physical Exam General: AAO x3, NAD  Dermatological: Skin is warm, dry and supple bilateral. Nails x 10 are well manicured; remaining integument appears unremarkable at this time. There are no open sores, no preulcerative lesions, no rash or signs of infection present.  Vascular: Dorsalis Pedis artery and Posterior Tibial artery pedal pulses are 2/4 bilateral with immedate capillary fill time. There is no pain with calf compression, swelling, warmth, erythema.   Neruologic: Grossly intact via light touch bilateral.Protective threshold with Semmes Wienstein monofilament intact to all pedal sites bilateral.   Musculoskeletal: There is swelling to the distal portion of the right second toe and there is tenderness palpation to pick this area as well as the third.  Minimal discomfort the second metatarsal head.  There is no erythema associated with swelling.  There is no bruising within the toenail the toenails appear to be firmly adhered there is no toenail involvement identified today.  Muscular strength 5/5 in all groups tested bilateral.  Gait: Unassisted, Nonantalgic.     Assessment & Plan:  Fracture right second toe distal phalanx -Treatment options discussed including all alternatives, risks, and complications -Etiology of symptoms were discussed -X-rays were obtained and reviewed with the patient. Fracture to the distal phalanx of the right second toe.  Given her pain and swelling I did dispense a surgical shoe for offloading.  Continue to ice and elevate as well.  Limit activity.  Follow-up in 2-3 weeks for follow-up or sooner if needed.  Call any questions or concerns.  Also discussed for her to monitor the toenail  for any signs or symptoms of infection or loosening  Vivi Barrack DPM

## 2017-09-05 ENCOUNTER — Encounter: Payer: Self-pay | Admitting: Podiatry

## 2017-09-05 ENCOUNTER — Ambulatory Visit (INDEPENDENT_AMBULATORY_CARE_PROVIDER_SITE_OTHER): Payer: BLUE CROSS/BLUE SHIELD

## 2017-09-05 ENCOUNTER — Ambulatory Visit: Payer: BLUE CROSS/BLUE SHIELD | Admitting: Podiatry

## 2017-09-05 DIAGNOSIS — S92501D Displaced unspecified fracture of right lesser toe(s), subsequent encounter for fracture with routine healing: Secondary | ICD-10-CM | POA: Diagnosis not present

## 2017-09-05 DIAGNOSIS — S92501A Displaced unspecified fracture of right lesser toe(s), initial encounter for closed fracture: Secondary | ICD-10-CM

## 2017-09-05 DIAGNOSIS — L603 Nail dystrophy: Secondary | ICD-10-CM | POA: Diagnosis not present

## 2017-09-06 NOTE — Progress Notes (Signed)
Subjective: 62 year old female presents the office today for follow-up evaluation of her fracture of the right second toe, distal phalanx.  She has been in the surgical shoe.  She still gets some pain to the toe with pressure and this may be coming more from the toenail as it is thickened.  She denies any drainage or pus to the toenail itself the nails think before the injury.  She feels the swelling to the toe is improved.  She still gets pain to the toe but overall much improved.  No recent injury or changes since I last saw her. Denies any systemic complaints such as fevers, chills, nausea, vomiting. No acute changes since last appointment, and no other complaints at this time.   Objective: AAO x3, NAD DP/PT pulses palpable bilaterally, CRT less than 3 seconds There is decrease edema to the distal portion of the right second toe.  There is also still some mild tenderness to the distal toe however this is also improved.  There is no other area tenderness of the remainder of the toe or metatarsals of the other digits.  He also is hypertrophic, dystrophic with ill-defined discoloration there is no signs of infection of the toenail itself. No open lesions or pre-ulcerative lesions.  No pain with calf compression, swelling, warmth, erythema  Assessment: Healing fracture right second distal phalanx; symptomatic onychomycosis, onychodystrophy  Plan: -All treatment options discussed with the patient including all alternatives, risks, complications.  -X-rays were obtained and reviewed.  No evidence of fracture of the distal phalanx.  No evidence of acute fracture or stress fracture. -At this time she can continue with surgical shoe as she feels better she can slowly transition to a supportive shoe.  Discussed a stiffer soled shoe may be helpful. -As a courtesy I did debride the toenail without any complications or bleeding.  There is some mild dried blood underneath the area but she has noticed this prior  to the injury as well and this was very mild.  I was able to remove some dried blood.  There is no extension of any hyperpigmentation of the surrounding skin or nail bed.  Discussed that if symptoms continue the toenail he may need to remove the nail. -Follow-up in the next 3 to 4 weeks if symptoms continue or sooner if any issues are to arise. -Patient encouraged to call the office with any questions, concerns, change in symptoms.   Vivi Barrack DPM

## 2017-10-06 ENCOUNTER — Ambulatory Visit: Payer: BLUE CROSS/BLUE SHIELD | Admitting: Podiatry

## 2018-03-23 ENCOUNTER — Institutional Professional Consult (permissible substitution): Payer: BLUE CROSS/BLUE SHIELD | Admitting: Neurology

## 2020-06-23 ENCOUNTER — Ambulatory Visit (INDEPENDENT_AMBULATORY_CARE_PROVIDER_SITE_OTHER): Payer: Medicare Other

## 2020-06-23 ENCOUNTER — Other Ambulatory Visit: Payer: Self-pay

## 2020-06-23 ENCOUNTER — Ambulatory Visit (INDEPENDENT_AMBULATORY_CARE_PROVIDER_SITE_OTHER): Payer: Medicare Other | Admitting: Podiatry

## 2020-06-23 ENCOUNTER — Telehealth: Payer: Self-pay | Admitting: Podiatry

## 2020-06-23 DIAGNOSIS — M722 Plantar fascial fibromatosis: Secondary | ICD-10-CM

## 2020-06-23 DIAGNOSIS — M7731 Calcaneal spur, right foot: Secondary | ICD-10-CM

## 2020-06-23 DIAGNOSIS — B351 Tinea unguium: Secondary | ICD-10-CM

## 2020-06-23 MED ORDER — TRIAMCINOLONE ACETONIDE 10 MG/ML IJ SUSP
10.0000 mg | Freq: Once | INTRAMUSCULAR | Status: AC
  Administered 2020-06-23: 10 mg

## 2020-06-23 NOTE — Patient Instructions (Addendum)
For instructions on how to put on your Plantar Fascial Brace, please visit [email or web address]   Plantar Fasciitis (Heel Spur Syndrome) with Rehab The plantar fascia is a fibrous, ligament-like, soft-tissue structure that spans the bottom of the foot. Plantar fasciitis is a condition that causes pain in the foot due to inflammation of the tissue. SYMPTOMS   Pain and tenderness on the underneath side of the foot.  Pain that worsens with standing or walking. CAUSES  Plantar fasciitis is caused by irritation and injury to the plantar fascia on the underneath side of the foot. Common mechanisms of injury include:  Direct trauma to bottom of the foot.  Damage to a small nerve that runs under the foot where the main fascia attaches to the heel bone.  Stress placed on the plantar fascia due to bone spurs. RISK INCREASES WITH:   Activities that place stress on the plantar fascia (running, jumping, pivoting, or cutting).  Poor strength and flexibility.  Improperly fitted shoes.  Tight calf muscles.  Flat feet.  Failure to warm-up properly before activity.  Obesity. PREVENTION  Warm up and stretch properly before activity.  Allow for adequate recovery between workouts.  Maintain physical fitness:  Strength, flexibility, and endurance.  Cardiovascular fitness.  Maintain a health body weight.  Avoid stress on the plantar fascia.  Wear properly fitted shoes, including arch supports for individuals who have flat feet.  PROGNOSIS  If treated properly, then the symptoms of plantar fasciitis usually resolve without surgery. However, occasionally surgery is necessary.  RELATED COMPLICATIONS   Recurrent symptoms that may result in a chronic condition.  Problems of the lower back that are caused by compensating for the injury, such as limping.  Pain or weakness of the foot during push-off following surgery.  Chronic inflammation, scarring, and partial or complete  fascia tear, occurring more often from repeated injections.  TREATMENT  Treatment initially involves the use of ice and medication to help reduce pain and inflammation. The use of strengthening and stretching exercises may help reduce pain with activity, especially stretches of the Achilles tendon. These exercises may be performed at home or with a therapist. Your caregiver may recommend that you use heel cups of arch supports to help reduce stress on the plantar fascia. Occasionally, corticosteroid injections are given to reduce inflammation. If symptoms persist for greater than 6 months despite non-surgical (conservative), then surgery may be recommended.   MEDICATION   If pain medication is necessary, then nonsteroidal anti-inflammatory medications, such as aspirin and ibuprofen, or other minor pain relievers, such as acetaminophen, are often recommended.  Do not take pain medication within 7 days before surgery.  Prescription pain relievers may be given if deemed necessary by your caregiver. Use only as directed and only as much as you need.  Corticosteroid injections may be given by your caregiver. These injections should be reserved for the most serious cases, because they may only be given a certain number of times.  HEAT AND COLD  Cold treatment (icing) relieves pain and reduces inflammation. Cold treatment should be applied for 10 to 15 minutes every 2 to 3 hours for inflammation and pain and immediately after any activity that aggravates your symptoms. Use ice packs or massage the area with a piece of ice (ice massage).  Heat treatment may be used prior to performing the stretching and strengthening activities prescribed by your caregiver, physical therapist, or athletic trainer. Use a heat pack or soak the injury in warm water.  SEEK IMMEDIATE MEDICAL   CARE IF:  Treatment seems to offer no benefit, or the condition worsens.  Any medications produce adverse side effects.   EXERCISES- RANGE OF MOTION (ROM) AND STRETCHING EXERCISES - Plantar Fasciitis (Heel Spur Syndrome) These exercises may help you when beginning to rehabilitate your injury. Your symptoms may resolve with or without further involvement from your physician, physical therapist or athletic trainer. While completing these exercises, remember:   Restoring tissue flexibility helps normal motion to return to the joints. This allows healthier, less painful movement and activity.  An effective stretch should be held for at least 30 seconds.  A stretch should never be painful. You should only feel a gentle lengthening or release in the stretched tissue.  RANGE OF MOTION - Toe Extension, Flexion  Sit with your right / left leg crossed over your opposite knee.  Grasp your toes and gently pull them back toward the top of your foot. You should feel a stretch on the bottom of your toes and/or foot.  Hold this stretch for 10 seconds.  Now, gently pull your toes toward the bottom of your foot. You should feel a stretch on the top of your toes and or foot.  Hold this stretch for 10 seconds. Repeat  times. Complete this stretch 3 times per day.   RANGE OF MOTION - Ankle Dorsiflexion, Active Assisted  Remove shoes and sit on a chair that is preferably not on a carpeted surface.  Place right / left foot under knee. Extend your opposite leg for support.  Keeping your heel down, slide your right / left foot back toward the chair until you feel a stretch at your ankle or calf. If you do not feel a stretch, slide your bottom forward to the edge of the chair, while still keeping your heel down.  Hold this stretch for 10 seconds. Repeat 3 times. Complete this stretch 2 times per day.   STRETCH  Gastroc, Standing  Place hands on wall.  Extend right / left leg, keeping the front knee somewhat bent.  Slightly point your toes inward on your back foot.  Keeping your right / left heel on the floor and your  knee straight, shift your weight toward the wall, not allowing your back to arch.  You should feel a gentle stretch in the right / left calf. Hold this position for 10 seconds. Repeat 3 times. Complete this stretch 2 times per day.  STRETCH  Soleus, Standing  Place hands on wall.  Extend right / left leg, keeping the other knee somewhat bent.  Slightly point your toes inward on your back foot.  Keep your right / left heel on the floor, bend your back knee, and slightly shift your weight over the back leg so that you feel a gentle stretch deep in your back calf.  Hold this position for 10 seconds. Repeat 3 times. Complete this stretch 2 times per day.  STRETCH  Gastrocsoleus, Standing  Note: This exercise can place a lot of stress on your foot and ankle. Please complete this exercise only if specifically instructed by your caregiver.   Place the ball of your right / left foot on a step, keeping your other foot firmly on the same step.  Hold on to the wall or a rail for balance.  Slowly lift your other foot, allowing your body weight to press your heel down over the edge of the step.  You should feel a stretch in your right / left calf.  Hold this   position for 10 seconds.  Repeat this exercise with a slight bend in your right / left knee. Repeat 3 times. Complete this stretch 2 times per day.   STRENGTHENING EXERCISES - Plantar Fasciitis (Heel Spur Syndrome)  These exercises may help you when beginning to rehabilitate your injury. They may resolve your symptoms with or without further involvement from your physician, physical therapist or athletic trainer. While completing these exercises, remember:   Muscles can gain both the endurance and the strength needed for everyday activities through controlled exercises.  Complete these exercises as instructed by your physician, physical therapist or athletic trainer. Progress the resistance and repetitions only as guided.  STRENGTH -  Towel Curls  Sit in a chair positioned on a non-carpeted surface.  Place your foot on a towel, keeping your heel on the floor.  Pull the towel toward your heel by only curling your toes. Keep your heel on the floor. Repeat 3 times. Complete this exercise 2 times per day.  STRENGTH - Ankle Inversion  Secure one end of a rubber exercise band/tubing to a fixed object (table, pole). Loop the other end around your foot just before your toes.  Place your fists between your knees. This will focus your strengthening at your ankle.  Slowly, pull your big toe up and in, making sure the band/tubing is positioned to resist the entire motion.  Hold this position for 10 seconds.  Have your muscles resist the band/tubing as it slowly pulls your foot back to the starting position. Repeat 3 times. Complete this exercises 2 times per day.  Document Released: 04/26/2005 Document Revised: 07/19/2011 Document Reviewed: 08/08/2008 [employer] Patient Information 2014 [employer]. 

## 2020-06-23 NOTE — Telephone Encounter (Signed)
I believe the cream that she is talking about was for the nail fungus. I have sent it to [facility] and they will contact her and it will be mailed to the house.

## 2020-06-23 NOTE — Telephone Encounter (Signed)
The patient said her pharmacy did not receive the cream that was supposed to be called in. Pharmacy is [facility] [address].

## 2020-06-23 NOTE — Progress Notes (Unsigned)
Subjective: 65 year old female presents the office today for concerns of pain to the bottom of her right heel to radiate to the back of the heel at times.  This started in the fall 2021 without any injury.  She states her when she is with walking more.  She states that is worse with the first up in the morning or after sitting for some time.  It started with activity.  No swelling.  She was present diagnosed with plantar fasciitis.  She was given to medication for swelling which had little help.  She has no secondary concerns of her toes becoming somewhat thickened discolored.  No pain the nails and denies any redness or joint swelling.  No other concerns today. Denies any systemic complaints such as fevers, chills, nausea, vomiting. No acute changes since last appointment, and no other complaints at this time.   X-ray at [facility] 03/11/2020 IMPRESSION:  Tiny plantar calcaneal spur. Mild hallux valgusand degenerative  change at the first metatarsophalangeal joint.   Objective: AAO x3, NAD DP/PT pulses palpable bilaterally, CRT less than 3 seconds Tenderness to palpation along the plantar medial tubercle of the calcaneus at the insertion of plantar fascia on the right foot. There is no pain along the course of the plantar fascia within the arch of the foot. Plantar fascia appears to be intact. There is no pain with lateral compression of the calcaneus or pain with vibratory sensation.  Minimal discomfort on the insertion of the Achilles tendon and the posterior calcaneus. No other areas of tenderness to bilateral lower extremities. Nails are mildly hypertrophic, dystrophic with yellow-brown discoloration.  No edema, erythema or signs of infection. No pain with calf compression, swelling, warmth, erythema  Assessment: 65 year old female with heel pain, plantar fasciitis; onychomycosis  Plan: -All treatment options discussed with the patient including all alternatives, risks, complications.   -X-rays obtained reviewed.  Small inferior calcaneal spurs present.  No evidence of acute fracture. -Steroid injection performed.  See procedure note below. -Plantar fascial brace dispensed. -Continue stretching, icing daily.  Discussed shoe modifications and orthotics -Discussed treatment options for nail fungus.  Was proceed with topical treatment.  Decided on a compound cream through [facility] for nail fungus. -Patient encouraged to call the office with any questions, concerns, change in symptoms.   Procedure: Injection Tendon/Ligament Discussed alternatives, risks, complications and verbal consent was obtained.  Location: RIGHT plantar fascia at the glabrous junction; medial approach. Skin Prep: Alcohol  Injectate: 0.5cc 0.5% marcaine plain, 0.5 cc 2% lidocaine plain and, 1 cc kenalog 10. Disposition: Patient tolerated procedure well. Injection site dressed with a band-aid.  Post-injection care was discussed and return precautions discussed.   Return in about 4 weeks (around 07/21/2020), or if symptoms worsen or fail to improve, for heel pain, plantar fasciitis .  Vivi Barrack DPM

## 2020-06-24 ENCOUNTER — Telehealth: Payer: Self-pay | Admitting: *Deleted

## 2020-06-24 ENCOUNTER — Other Ambulatory Visit: Payer: Self-pay

## 2020-06-24 MED ORDER — NONFORMULARY OR COMPOUNDED ITEM: 3 refills | Status: DC

## 2020-06-24 NOTE — Telephone Encounter (Signed)
Patient is calling about medication that was prescribed today by physician,was not aware that she could not pick up from her regular pharmacy, explained that it will be shipped to her house.  She would also like a copy of her AVS summary mailed to her, did not receive a copy of instructions.

## 2020-06-24 NOTE — Telephone Encounter (Signed)
AVS printed and mailed to patient.

## 2020-07-14 ENCOUNTER — Telehealth: Payer: Self-pay | Admitting: Internal Medicine

## 2020-07-14 NOTE — Telephone Encounter (Signed)
Resend this note back to me after the records are on my desk.  Thanks

## 2020-07-14 NOTE — Telephone Encounter (Signed)
Hey Dr Marina Goodell, this pt is being referred to [place] for a colonoscopy but it looks like the pt had a colonoscopy done on 2014, will send the records to you for review, please advise on scheduling.

## 2020-07-16 NOTE — Telephone Encounter (Signed)
GI RECORDS REVIEWED  I was asked to review these records review appropriateness of follow-up colonoscopy.  The patient underwent complete colonoscopy with good preparation with Dr. Willis Modena February 16, 2013.  The examination revealed hemorrhoids and diverticulosis.  No polyps.  Indication was family history of polyps.  Follow-up recommendation by Dr. Dulce Sellar was 5 years.  Based on the available information, and in keeping with the current guidelines, the appropriate time for her follow-up screening colonoscopy would be 10 years from her previous examination.  Please make recall colonoscopy for October 2024.  Patient has clinical issues or questions regarding this recommendation, she should schedule a routine office appointment.  Wilhemina Bonito. Eda Keys., M.D. [facility] Division of Gastroenterology

## 2020-07-18 NOTE — Telephone Encounter (Signed)
Left msg on pt's v/m.

## 2020-07-21 ENCOUNTER — Other Ambulatory Visit: Payer: Self-pay

## 2020-07-21 ENCOUNTER — Ambulatory Visit (INDEPENDENT_AMBULATORY_CARE_PROVIDER_SITE_OTHER): Payer: Medicare Other | Admitting: Podiatry

## 2020-07-21 DIAGNOSIS — M7731 Calcaneal spur, right foot: Secondary | ICD-10-CM | POA: Diagnosis not present

## 2020-07-21 DIAGNOSIS — M722 Plantar fascial fibromatosis: Secondary | ICD-10-CM | POA: Diagnosis not present

## 2020-07-21 NOTE — Progress Notes (Signed)
Subjective: 65 year old female presents the office for follow-up evaluation of right heel pain, plantar fasciitis.  She states that she has not been stretching regularly she stopped icing after couple days.  She says overall her heel is doing better but she knows of the week and the discomfort started come back.  Denies any recent injury.  She had no complications after the steroid injection.  She states that yesterday she had some pain when she sat down and stood back up.  Pain got better with ambulation.  She has no other concerns today. Denies any systemic complaints such as fevers, chills, nausea, vomiting. No acute changes since last appointment, and no other complaints at this time.   Objective: AAO x3, NAD DP/PT pulses palpable bilaterally, CRT less than 3 seconds On the right side there is continuation of tenderness on plantar medial tubercle of the calcaneus and insertion of plantar fascial opening improved.  No pain in the arch of the foot.  No area pinpoint tenderness.  No pain with lateral compression of calcaneus.  No edema, erythema.  Mild pulling sensation with dorsiflexion of the ankle.  MMT 5/5.  Negative Tinel sign. No pain with calf compression, swelling, warmth, erythema  Assessment: Right heel pain, plantar fasciitis  Plan: -All treatment options discussed with the patient including all alternatives, risks, complications.  -Discussed doing a second steroid injection but we hold off on this today.  Encourage stretching, icing daily.  Dispensed a night splint.  Discussed shoe modifications and continue the over-the-counter arch supports that she has. -Patient encouraged to call the office with any questions, concerns, change in symptoms.   Return in about 2 months (around 09/20/2020), or if symptoms worsen or fail to improve.  Vivi Barrack DPM

## 2020-07-21 NOTE — Patient Instructions (Signed)
For instructions on how to put on your Night Splint, please visit [email or web address]   Plantar Fasciitis (Heel Spur Syndrome) with Rehab The plantar fascia is a fibrous, ligament-like, soft-tissue structure that spans the bottom of the foot. Plantar fasciitis is a condition that causes pain in the foot due to inflammation of the tissue. SYMPTOMS   Pain and tenderness on the underneath side of the foot.  Pain that worsens with standing or walking. CAUSES  Plantar fasciitis is caused by irritation and injury to the plantar fascia on the underneath side of the foot. Common mechanisms of injury include:  Direct trauma to bottom of the foot.  Damage to a small nerve that runs under the foot where the main fascia attaches to the heel bone.  Stress placed on the plantar fascia due to bone spurs. RISK INCREASES WITH:   Activities that place stress on the plantar fascia (running, jumping, pivoting, or cutting).  Poor strength and flexibility.  Improperly fitted shoes.  Tight calf muscles.  Flat feet.  Failure to warm-up properly before activity.  Obesity. PREVENTION  Warm up and stretch properly before activity.  Allow for adequate recovery between workouts.  Maintain physical fitness:  Strength, flexibility, and endurance.  Cardiovascular fitness.  Maintain a health body weight.  Avoid stress on the plantar fascia.  Wear properly fitted shoes, including arch supports for individuals who have flat feet.  PROGNOSIS  If treated properly, then the symptoms of plantar fasciitis usually resolve without surgery. However, occasionally surgery is necessary.  RELATED COMPLICATIONS   Recurrent symptoms that may result in a chronic condition.  Problems of the lower back that are caused by compensating for the injury, such as limping.  Pain or weakness of the foot during push-off following surgery.  Chronic inflammation, scarring, and partial or complete fascia tear,  occurring more often from repeated injections.  TREATMENT  Treatment initially involves the use of ice and medication to help reduce pain and inflammation. The use of strengthening and stretching exercises may help reduce pain with activity, especially stretches of the Achilles tendon. These exercises may be performed at home or with a therapist. Your caregiver may recommend that you use heel cups of arch supports to help reduce stress on the plantar fascia. Occasionally, corticosteroid injections are given to reduce inflammation. If symptoms persist for greater than 6 months despite non-surgical (conservative), then surgery may be recommended.   MEDICATION   If pain medication is necessary, then nonsteroidal anti-inflammatory medications, such as aspirin and ibuprofen, or other minor pain relievers, such as acetaminophen, are often recommended.  Do not take pain medication within 7 days before surgery.  Prescription pain relievers may be given if deemed necessary by your caregiver. Use only as directed and only as much as you need.  Corticosteroid injections may be given by your caregiver. These injections should be reserved for the most serious cases, because they may only be given a certain number of times.  HEAT AND COLD  Cold treatment (icing) relieves pain and reduces inflammation. Cold treatment should be applied for 10 to 15 minutes every 2 to 3 hours for inflammation and pain and immediately after any activity that aggravates your symptoms. Use ice packs or massage the area with a piece of ice (ice massage).  Heat treatment may be used prior to performing the stretching and strengthening activities prescribed by your caregiver, physical therapist, or athletic trainer. Use a heat pack or soak the injury in warm water.  SEEK IMMEDIATE MEDICAL CARE   IF:  Treatment seems to offer no benefit, or the condition worsens.  Any medications produce adverse side effects.  EXERCISES- RANGE OF  MOTION (ROM) AND STRETCHING EXERCISES - Plantar Fasciitis (Heel Spur Syndrome) These exercises may help you when beginning to rehabilitate your injury. Your symptoms may resolve with or without further involvement from your physician, physical therapist or athletic trainer. While completing these exercises, remember:   Restoring tissue flexibility helps normal motion to return to the joints. This allows healthier, less painful movement and activity.  An effective stretch should be held for at least 30 seconds.  A stretch should never be painful. You should only feel a gentle lengthening or release in the stretched tissue.  RANGE OF MOTION - Toe Extension, Flexion  Sit with your right / left leg crossed over your opposite knee.  Grasp your toes and gently pull them back toward the top of your foot. You should feel a stretch on the bottom of your toes and/or foot.  Hold this stretch for 10 seconds.  Now, gently pull your toes toward the bottom of your foot. You should feel a stretch on the top of your toes and or foot.  Hold this stretch for 10 seconds. Repeat  times. Complete this stretch 3 times per day.   RANGE OF MOTION - Ankle Dorsiflexion, Active Assisted  Remove shoes and sit on a chair that is preferably not on a carpeted surface.  Place right / left foot under knee. Extend your opposite leg for support.  Keeping your heel down, slide your right / left foot back toward the chair until you feel a stretch at your ankle or calf. If you do not feel a stretch, slide your bottom forward to the edge of the chair, while still keeping your heel down.  Hold this stretch for 10 seconds. Repeat 3 times. Complete this stretch 2 times per day.   STRETCH  Gastroc, Standing  Place hands on wall.  Extend right / left leg, keeping the front knee somewhat bent.  Slightly point your toes inward on your back foot.  Keeping your right / left heel on the floor and your knee straight, shift  your weight toward the wall, not allowing your back to arch.  You should feel a gentle stretch in the right / left calf. Hold this position for 10 seconds. Repeat 3 times. Complete this stretch 2 times per day.  STRETCH  Soleus, Standing  Place hands on wall.  Extend right / left leg, keeping the other knee somewhat bent.  Slightly point your toes inward on your back foot.  Keep your right / left heel on the floor, bend your back knee, and slightly shift your weight over the back leg so that you feel a gentle stretch deep in your back calf.  Hold this position for 10 seconds. Repeat 3 times. Complete this stretch 2 times per day.  STRETCH  Gastrocsoleus, Standing  Note: This exercise can place a lot of stress on your foot and ankle. Please complete this exercise only if specifically instructed by your caregiver.   Place the ball of your right / left foot on a step, keeping your other foot firmly on the same step.  Hold on to the wall or a rail for balance.  Slowly lift your other foot, allowing your body weight to press your heel down over the edge of the step.  You should feel a stretch in your right / left calf.  Hold this position   for 10 seconds.  Repeat this exercise with a slight bend in your right / left knee. Repeat 3 times. Complete this stretch 2 times per day.   STRENGTHENING EXERCISES - Plantar Fasciitis (Heel Spur Syndrome)  These exercises may help you when beginning to rehabilitate your injury. They may resolve your symptoms with or without further involvement from your physician, physical therapist or athletic trainer. While completing these exercises, remember:   Muscles can gain both the endurance and the strength needed for everyday activities through controlled exercises.  Complete these exercises as instructed by your physician, physical therapist or athletic trainer. Progress the resistance and repetitions only as guided.  STRENGTH - Towel Curls  Sit in  a chair positioned on a non-carpeted surface.  Place your foot on a towel, keeping your heel on the floor.  Pull the towel toward your heel by only curling your toes. Keep your heel on the floor. Repeat 3 times. Complete this exercise 2 times per day.  STRENGTH - Ankle Inversion  Secure one end of a rubber exercise band/tubing to a fixed object (table, pole). Loop the other end around your foot just before your toes.  Place your fists between your knees. This will focus your strengthening at your ankle.  Slowly, pull your big toe up and in, making sure the band/tubing is positioned to resist the entire motion.  Hold this position for 10 seconds.  Have your muscles resist the band/tubing as it slowly pulls your foot back to the starting position. Repeat 3 times. Complete this exercises 2 times per day.  Document Released: 04/26/2005 Document Revised: 07/19/2011 Document Reviewed: 08/08/2008 [employer] Patient Information 2014 [employer].  

## 2020-08-04 ENCOUNTER — Ambulatory Visit (INDEPENDENT_AMBULATORY_CARE_PROVIDER_SITE_OTHER): Payer: Medicare Other | Admitting: Podiatry

## 2020-08-04 ENCOUNTER — Other Ambulatory Visit: Payer: Self-pay

## 2020-08-04 DIAGNOSIS — M722 Plantar fascial fibromatosis: Secondary | ICD-10-CM

## 2020-08-04 DIAGNOSIS — M7731 Calcaneal spur, right foot: Secondary | ICD-10-CM

## 2020-08-04 MED ORDER — TRIAMCINOLONE ACETONIDE 10 MG/ML IJ SUSP: 10.0000 mg | Freq: Once | INTRAMUSCULAR | Status: DC

## 2020-08-07 NOTE — Progress Notes (Signed)
Subjective: 65 year old female presents the office for follow evaluation of right heel pain, plantar fasciitis.  She says overall she is doing better but not 100% well yet.  She denies any recent injury or trauma.  No swelling.  She still been using the night splint as well as trying to stretch.  She has been doing yoga which has been helpful as well.  She has no other concerns today. Denies any systemic complaints such as fevers, chills, nausea, vomiting. No acute changes since last appointment, and no other complaints at this time.   Objective: AAO x3, NAD DP/PT pulses palpable bilaterally, CRT less than 3 seconds There is still mild tenderness palpation on the plantar medial tubercle of the calcaneus at the insertion of plantar fashion the right side.  Plantar fascial appears to be intact.  There is no pain with lateral compression of calcaneus.  No pain to the posterior calcaneus.  No pain the Achilles tendon.  No other areas of discomfort.  No pain with calf compression, swelling, warmth, erythema  Assessment: 65 year old female with right heel pain, improving plantar fasciitis  Plan: -All treatment options discussed with the patient including all alternatives, risks, complications.  -Steroid injection performed.  See procedure note below.  Continue night splint, stretching, icing daily.  Continue with supportive shoes, inserts. -Patient encouraged to call the office with any questions, concerns, change in symptoms.   Procedure: Injection Tendon/Ligament Discussed alternatives, risks, complications and verbal consent was obtained.  Location: Right plantar fascia at the glabrous junction; medial approach. Skin Prep: Alcohol  Injectate: 0.5cc 0.5% marcaine plain, 0.5 cc 2% lidocaine plain and, 1 cc kenalog 10. Disposition: Patient tolerated procedure well. Injection site dressed with a band-aid.  Post-injection care was discussed and return precautions discussed.   No follow-ups on  file.  Vivi Barrack DPM

## 2020-10-07 ENCOUNTER — Ambulatory Visit: Payer: Medicare Other | Admitting: Podiatry

## 2022-10-28 ENCOUNTER — Telehealth: Payer: Self-pay | Admitting: Internal Medicine

## 2022-10-28 NOTE — Telephone Encounter (Signed)
No problem. Thanks 

## 2022-10-28 NOTE — Telephone Encounter (Signed)
Good afternoon, Dr. Lavon Paganini, would you be willing to take on this patient. I advised her of your busy schedule and that I could not guarantee the acceptance, but she still wanted to try.Please review and advise.Thank you

## 2022-10-28 NOTE — Telephone Encounter (Signed)
Good afternoon  The following PT is asking to be transferred to Dr. Lavon Paganini because she wants to have a female. Would you be willing to release this PT?

## 2022-10-30 NOTE — Telephone Encounter (Signed)
Ok, hopefully patient is aware that she may not be able to have an appointment with me until Oct/Nov

## 2022-11-01 NOTE — Telephone Encounter (Signed)
Patient is calling to schedule a colonoscopy, notified patient she is not due until October she states she is having constipation due to her anxiety medication and wants to know if that is something that you would want to see her in the office for. Says that if she stops the medication she is no longer constipated so she is not sure if it was notable. Please advise

## 2022-11-15 NOTE — Telephone Encounter (Signed)
Please schedule office follow up visit next available. Thanks

## 2023-04-01 ENCOUNTER — Other Ambulatory Visit: Payer: Self-pay | Admitting: Gastroenterology

## 2023-04-01 ENCOUNTER — Telehealth: Payer: Self-pay | Admitting: Gastroenterology

## 2023-04-01 ENCOUNTER — Telehealth: Payer: Self-pay

## 2023-04-01 ENCOUNTER — Ambulatory Visit: Payer: Medicare HMO | Admitting: Gastroenterology

## 2023-04-01 ENCOUNTER — Encounter: Payer: Self-pay | Admitting: Gastroenterology

## 2023-04-01 VITALS — BP 140/68 | HR 54 | Ht 67.0 in | Wt 221.4 lb

## 2023-04-01 DIAGNOSIS — Z1211 Encounter for screening for malignant neoplasm of colon: Secondary | ICD-10-CM

## 2023-04-01 DIAGNOSIS — K5904 Chronic idiopathic constipation: Secondary | ICD-10-CM

## 2023-04-01 MED ORDER — ONDANSETRON 8 MG PO TBDP: 8.0000 mg | ORAL_TABLET | Freq: Three times a day (TID) | ORAL | 0 refills | Status: DC | PRN

## 2023-04-01 MED ORDER — SUTAB 1479-225-188 MG PO TABS: 24.0000 | ORAL_TABLET | ORAL | 0 refills | Status: DC

## 2023-04-01 NOTE — Progress Notes (Signed)
[name]    [id number]    [birth year]  Primary Care Physician:Zanard, Hinton Dyer, MD  Referring Physician: Gillian Scarce, MD [address] [place],  [place] [address]   Chief complaint: Colon cancer screening  Discussed the use of AI scribe software for clinical note transcription with the patient, who gave verbal consent to proceed.  History of Present Illness   [name], a patient with a history of atrial fibrillation on Eliquis, presents for a consultation to schedule colonoscopy. She reports no recent blood in stool, stomach pains, or changes in bowel habits. However, she notes a recent episode of constipation following consumption of blackberry cobbler, which she believes may have caused some discomfort in her abdomen. She describes the pain as an intermittent, electrical, fuzzy shock-like sensation that comes and goes, possibly correlating with bowel movements.  She typically has one bowel movement per day but has noticed recent constipation. If she does not have a bowel movement for three days, she takes milk of magnesia, which she reports effectively resolves the issue. She also notes allergies to pork and onions, which she states cause her digestive system to "stop," requiring a more potent intervention to restore normal function.  She has been managing her atrial fibrillation well and is not currently in AFib. She reports feeling a difference when she is in AFib versus when she is not. She has had two ablations in the past with no reported issues with anesthesia. She does note feeling "mixed up" for a week or two following anesthesia but does not report any serious side effects.          Outpatient Encounter Medications as of 04/01/2023  Medication Sig   acetaminophen (TYLENOL) 325 MG tablet Take 650 mg by mouth 2 (two) times daily as needed (pain).   amLODipine (NORVASC) 5 MG tablet Take 5 mg by mouth daily.   apixaban (ELIQUIS) 5 MG TABS tablet Take  5 mg by mouth 2 (two) times daily.   atorvastatin (LIPITOR) 20 MG tablet Take 20 mg by mouth daily.   cetirizine (ZYRTEC) 10 MG tablet Take 10 mg by mouth daily.   Coenzyme Q10 (CO Q10 PO) Take by mouth.   dofetilide (TIKOSYN) 500 MCG capsule Take 500 mcg by mouth 2 (two) times daily.   Multiple Vitamins-Minerals (MULTIVITAMIN WITH MINERALS) tablet Take 1 tablet by mouth daily.   omeprazole (PRILOSEC) 20 MG capsule Take 20 mg by mouth every 12 (twelve) hours.    [DISCONTINUED] ondansetron (ZOFRAN-ODT) 8 MG disintegrating tablet Take 1 tablet (8 mg total) by mouth every 8 (eight) hours as needed for nausea or vomiting.   [DISCONTINUED] Sodium Sulfate-Mag Sulfate-KCl (SUTAB) [phone] MG TABS Take 24 tablets by mouth as directed.   ondansetron (ZOFRAN-ODT) 8 MG disintegrating tablet Take 1 tablet (8 mg total) by mouth every 8 (eight) hours as needed for nausea or vomiting.   Sodium Sulfate-Mag Sulfate-KCl (SUTAB) [phone] MG TABS Take 24 tablets by mouth as directed.   [DISCONTINUED] aspirin EC 81 MG tablet Take 81 mg by mouth daily.   [DISCONTINUED] Cholecalciferol (VITAMIN D3 PO) Take by mouth.   [DISCONTINUED] flecainide (TAMBOCOR) 100 MG tablet Take 150 mg by mouth every 12 (twelve) hours.   [DISCONTINUED] glycerin adult 2 G SUPP Place 1 suppository rectally once as needed for moderate constipation.   [DISCONTINUED] hydrochlorothiazide (MICROZIDE) 12.5 MG capsule Take 12.5 mg by mouth daily.   [DISCONTINUED] losartan (COZAAR) 100 MG tablet TAKE 1  TABLET BY MOUTH EVERY DAY   [DISCONTINUED] metoprolol succinate (TOPROL-XL) 25 MG 24 hr tablet Take 25 mg by mouth daily.   [DISCONTINUED] NONFORMULARY OR COMPOUNDED ITEM Antifungal solution: Terbinafine 3%, Fluconazole 2%, Tea Tree Oil 5%, Urea 10%, Ibuprofen 2% in DMSO suspension #22mL   [DISCONTINUED] simvastatin (ZOCOR) 40 MG tablet TAKE 1 TABLET (40 MG TOTAL) BY MOUTH EVERY EVENING.   [DISCONTINUED] sucralfate (CARAFATE) 1 G tablet Take 1 g  by mouth 4 (four) times daily -  with meals and at bedtime.    Facility-Administered Encounter Medications as of 04/01/2023  Medication   triamcinolone acetonide (KENALOG) 10 MG/ML injection 10 mg    Allergies as of 04/01/2023 - Review Complete 04/01/2023  Allergen Reaction Noted   Pork allergy Nausea And Vomiting 04/08/2019   Lisinopril Other (See Comments) 09/19/2013   Onion Other (See Comments) 06/02/2014   Diltiazem Rash 06/02/2014   Tetracycline Rash    Xarelto [rivaroxaban] Rash 06/02/2014    Past Medical History:  Diagnosis Date   A-fib (HCC)    Chicken pox    DM mellitus, gestational    HTN (hypertension)    Hyperlipidemia    Paroxysmal atrial fibrillation (HCC)    Polyarthralgia    Premature menopause     Past Surgical History:  Procedure Laterality Date   ATRIAL FIBRILLATION ABLATION     TONSILECTOMY, ADENOIDECTOMY, BILATERAL MYRINGOTOMY AND TUBES      Family History  Problem Relation Age of Onset   Stroke Father        TIA/ pacemker   Cancer Father    Coronary artery disease Father    Coronary artery disease Mother    Stroke Other        grandfather   Diabetes Other        grandmother   Stroke Sister    Stroke Other        aunt   Stroke Other        unlce    Social History   Socioeconomic History   Marital status: Married    Spouse name: Not on file   Number of children: 2   Years of education: Not on file   Highest education level: Not on file  Occupational History   Occupation: Dept. Production designer, [employer]    Employer: OTHER  Tobacco Use   Smoking status: Never   Smokeless tobacco: Never  Substance and Sexual Activity   Alcohol use: Yes    Alcohol/week: 0.0 standard drinks of alcohol   Drug use: No   Sexual activity: Not on file  Other Topics Concern   Not on file  Social History Narrative   Not on file   Social Determinants of Health   Financial Resource Strain: Not on file  Food Insecurity: Not on file  Transportation  Needs: Not on file  Physical Activity: Not on file  Stress: Not on file  Social Connections: Not on file  Intimate Partner Violence: Not on file      Review of systems: All other review of systems negative except as mentioned in the HPI.   Physical Exam: Vitals:   04/01/23 1047  BP: (!) 140/68  Pulse: (!) 54   Body mass index is 34.68 kg/m. Gen:      No acute distress HEENT:  sclera anicteric CV: regular s1s2 Lungs: B/l Clear Abd:      soft, non-tender; no palpable masses, no distension Ext:    No edema Neuro: alert and oriented x 3 Psych:  normal mood and affect  Data Reviewed:  Reviewed labs, radiology imaging, old records and pertinent past GI work up  Assessment and Plan/Recommendations: 67 year old very pleasant female here to discuss surveillance colonoscopy for colorectal cancer screening    Constipation Patient reports constipation and abdominal discomfort. Likely due to recent dietary changes and possible diverticulosis. No alarming symptoms such as blood in stool or significant change in bowel habits. -Start MiraLAX one capsule daily to improve bowel regularity. -If bowel movements become too frequent, adjust MiraLAX to every other day.  Colonoscopy Due for routine colonoscopy, average risk. No alarming symptoms. Patient has a history of atrial fibrillation and is on Eliquis. -Schedule colonoscopy for 04/19/2023 at 8:00 AM. -Check with cardiologist regarding holding Eliquis 1-2 days prior to procedure. -Provide patient with instructions for bowel preparation and nausea medication if needed. The risks and benefits as well as alternatives of endoscopic procedure(s) have been discussed and reviewed. All questions answered. The patient agrees to proceed.   Atrial Fibrillation Stable, no current symptoms. -Continue current management. -Check with cardiologist regarding holding Eliquis 1-2 days prior to colonoscopy.      The patient was provided an  opportunity to ask questions and all were answered. The patient agreed with the plan and demonstrated an understanding of the instructions.  Iona Beard , MD    CC: Gillian Scarce, MD

## 2023-04-01 NOTE — Patient Instructions (Addendum)
VISIT SUMMARY:  Miss [name], you came in today for a consultation regarding your upcoming colonoscopy. We discussed your recent episode of constipation and abdominal discomfort, as well as your history of atrial fibrillation. You are managing your atrial fibrillation well and are not currently experiencing any symptoms.  YOUR PLAN:  -CONSTIPATION: Constipation is when you have infrequent or difficult bowel movements. Your recent constipation is likely due to dietary changes. To help with this, start taking MiraLAX one capsule daily to improve bowel regularity. If your bowel movements become too frequent, adjust MiraLAX to every other day.  -COLONOSCOPY: A colonoscopy is a procedure to examine the inside of your colon. You are due for a routine colonoscopy and it is scheduled for 04/19/2023 at 8:00 AM. We will check with your cardiologist about holding Eliquis 1-2 days before the procedure. You will receive instructions for bowel preparation and nausea medication if needed.  -ATRIAL FIBRILLATION: Atrial fibrillation is an irregular and often rapid heart rate. Your condition is stable and you are not currently experiencing any symptoms. Continue with your current management and we will check with your cardiologist about holding Eliquis 1-2 days before your colonoscopy.  INSTRUCTIONS:  Your colonoscopy is scheduled for 04/19/2023 at 8:00 AM.  Follow the provided instructions for bowel preparation and take nausea medication if needed.  We have sent the following medications to your pharmacy for you to pick up at your convenience: Sutab   Take Zofran 30 minutes before starting preparation (sutab).    HOLD ELIQUIS 2 days prior to procedure starting on 04/17/23., restart as soon as recommended by GI provider post-op, per Dorthula Rue, PA-C   Due to recent changes in healthcare laws, you may see the results of your imaging and laboratory studies on MyChart before your provider has had a chance to  review them.  We understand that in some cases there may be results that are confusing or concerning to you. Not all laboratory results come back in the same time frame and the provider may be waiting for multiple results in order to interpret others.  Please give [place] 48 hours in order for your provider to thoroughly review all the results before contacting the office for clarification of your results.   Thank you for choosing me and [facility].  Dr.Kavitha Nandigam

## 2023-04-01 NOTE — Telephone Encounter (Signed)
Pharmacy rep called and stated the su-prep was not covered under patient insurance and stated if we can change prescription to Peg [phone] or Gav Lite C. A good call back number is [phone]. Please advise.

## 2023-04-01 NOTE — Telephone Encounter (Signed)
Called patient to advise her that we have samples of Sutab. I have placed sample at front desk for patient. If she would like for prescription to be changed, I can change it as well. Left detailed message for patient asking for return call.

## 2023-04-01 NOTE — Telephone Encounter (Signed)
Per Dorthula Rue ,PA-C- Pt has upcoming consult with GI team and likely surveillance colonoscopy after that. OK to hold [facility] for 2-3 days prior, restart as soon as recommended by GI provider post-op (ideally within 1-2 days). Patient was informed at visit today with Dr.Nandigam.

## 2023-04-19 ENCOUNTER — Ambulatory Visit: Payer: Medicare HMO | Admitting: Gastroenterology

## 2023-04-19 ENCOUNTER — Encounter: Payer: Self-pay | Admitting: Gastroenterology

## 2023-04-19 VITALS — BP 120/58 | HR 71 | Temp 97.3°F | Resp 15 | Ht 67.0 in | Wt 221.0 lb

## 2023-04-19 DIAGNOSIS — K5730 Diverticulosis of large intestine without perforation or abscess without bleeding: Secondary | ICD-10-CM

## 2023-04-19 DIAGNOSIS — K635 Polyp of colon: Secondary | ICD-10-CM

## 2023-04-19 DIAGNOSIS — Z1211 Encounter for screening for malignant neoplasm of colon: Secondary | ICD-10-CM

## 2023-04-19 DIAGNOSIS — K644 Residual hemorrhoidal skin tags: Secondary | ICD-10-CM

## 2023-04-19 DIAGNOSIS — D122 Benign neoplasm of ascending colon: Secondary | ICD-10-CM

## 2023-04-19 DIAGNOSIS — K648 Other hemorrhoids: Secondary | ICD-10-CM

## 2023-04-19 MED ORDER — SODIUM CHLORIDE 0.9 % IV SOLN: 500.0000 mL | INTRAVENOUS | Status: DC

## 2023-04-19 NOTE — Patient Instructions (Addendum)
Await pathology results.  Continue present medications. Resume Eliquis tomorrow.  Resume prior diet.  YOU HAD AN ENDOSCOPIC PROCEDURE TODAY AT THE [facility]:   Refer to the procedure report that was given to you for any specific questions about what was found during the examination.  If the procedure report does not answer your questions, please call your gastroenterologist to clarify.  If you requested that your care partner not be given the details of your procedure findings, then the procedure report has been included in a sealed envelope for you to review at your convenience later.  YOU SHOULD EXPECT: Some feelings of bloating in the abdomen. Passage of more gas than usual.  Walking can help get rid of the air that was put into your GI tract during the procedure and reduce the bloating. If you had a lower endoscopy (such as a colonoscopy or flexible sigmoidoscopy) you may notice spotting of blood in your stool or on the toilet paper. If you underwent a bowel prep for your procedure, you may not have a normal bowel movement for a few days.  Please Note:  You might notice some irritation and congestion in your nose or some drainage.  This is from the oxygen used during your procedure.  There is no need for concern and it should clear up in a day or so.  SYMPTOMS TO REPORT IMMEDIATELY:  Following lower endoscopy (colonoscopy or flexible sigmoidoscopy):  Excessive amounts of blood in the stool  Significant tenderness or worsening of abdominal pains  Swelling of the abdomen that is new, acute  Fever of 100F or higher  For urgent or emergent issues, a gastroenterologist can be reached at any hour by calling [phone]. Do not use MyChart messaging for urgent concerns.    DIET:  We do recommend a small meal at first, but then you may proceed to your regular diet.  Drink plenty of fluids but you should avoid alcoholic beverages for 24 hours.  ACTIVITY:  You should plan to  take it easy for the rest of today and you should NOT DRIVE or use heavy machinery until tomorrow (because of the sedation medicines used during the test).    FOLLOW UP: Our staff will call the number listed on your records the next business day following your procedure.  We will call around 7:15- 8:00 am to check on you and address any questions or concerns that you may have regarding the information given to you following your procedure. If we do not reach you, we will leave a message.     If any biopsies were taken you will be contacted by phone or by letter within the next 1-3 weeks.  Please call us at [phone] if you have not heard about the biopsies in 3 weeks.    SIGNATURES/CONFIDENTIALITY: You and/or your care partner have signed paperwork which will be entered into your electronic medical record.  These signatures attest to the fact that that the information above on your After Visit Summary has been reviewed and is understood.  Full responsibility of the confidentiality of this discharge information lies with you and/or your care-partner.

## 2023-04-19 NOTE — Progress Notes (Signed)
Sedate, gd SR, tolerated procedure well, VSS, report to RN 

## 2023-04-19 NOTE — Progress Notes (Signed)
Please refer to office visit note 04/01/23. No additional changes in H&P Patient is appropriate for planned procedure(s) and anesthesia in an ambulatory setting  K. Scherry Ran , MD [phone]

## 2023-04-19 NOTE — Op Note (Addendum)
[facility] Patient Name: [name] Procedure Date: 04/19/2023 8:03 AM MRN: [id number] Endoscopist: Napoleon Form , MD, [id number] Age: 67 Referring MD:  Date of Birth: [birth year] Gender: Female Account #: [id number] Procedure:                Colonoscopy Indications:              Screening for colorectal malignant neoplasm Medicines:                Monitored Anesthesia Care Procedure:                Pre-Anesthesia Assessment:                           - Prior to the procedure, a History and Physical                            was performed, and patient medications and                            allergies were reviewed. The patient's tolerance of                            previous anesthesia was also reviewed. The risks                            and benefits of the procedure and the sedation                            options and risks were discussed with the patient.                            All questions were answered, and informed consent                            was obtained. Prior Anticoagulants: The patient                            last took Eliquis (apixaban) 2 days prior to the                            procedure. ASA Grade Assessment: II - A patient                            with mild systemic disease. After reviewing the                            risks and benefits, the patient was deemed in                            satisfactory condition to undergo the procedure.                           After obtaining informed consent, the colonoscope  was passed under direct vision. Throughout the                            procedure, the patient's blood pressure, pulse, and                            oxygen saturations were monitored continuously. The                            Olympus Scope [id number] was introduced through the                            anus and advanced to the the cecum, identified by                             appendiceal orifice and ileocecal valve. The                            colonoscopy was performed without difficulty. The                            patient tolerated the procedure well. The quality                            of the bowel preparation was adequate. The                            ileocecal valve, appendiceal orifice, and rectum                            were photographed. Scope In: 8:12:40 AM Scope Out: 8:34:32 AM Scope Withdrawal Time: 0 hours 15 minutes 45 seconds  Total Procedure Duration: 0 hours 21 minutes 52 seconds  Findings:                 The perianal and digital rectal examinations were                            normal.                           Two sessile polyps were found in the ascending                            colon. The polyps were 5 to 11 mm in size. These                            polyps were removed with a cold snare. Resection                            and retrieval were complete.                           Scattered small-mouthed diverticula were found in  the sigmoid colon, descending colon, transverse                            colon and ascending colon.                           Non-bleeding external and internal hemorrhoids were                            found during retroflexion. The hemorrhoids were                            small. Complications:            No immediate complications. Estimated Blood Loss:     Estimated blood loss was minimal. Impression:               - Two 5 to 11 mm polyps in the ascending colon,                            removed with a cold snare. Resected and retrieved.                           - Diverticulosis in the sigmoid colon, in the                            descending colon, in the transverse colon and in                            the ascending colon.                           - Non-bleeding external and internal hemorrhoids. Recommendation:           - Patient has a  contact number available for                            emergencies. The signs and symptoms of potential                            delayed complications were discussed with the                            patient. Return to normal activities tomorrow.                            Written discharge instructions were provided to the                            patient.                           - Resume previous diet.                           - Continue present medications.                           -  Await pathology results.                           - Repeat colonoscopy in 3 years for surveillance                            based on pathology results.                           - Resume Eliquis (apixaban) at prior dose tomorrow.                            Refer to managing physician for further adjustment                            of therapy. Napoleon Form, MD 04/19/2023 8:46:39 AM This report has been signed electronically.

## 2023-04-19 NOTE — Progress Notes (Signed)
Pt's states no medical or surgical changes since previsit or office visit. 

## 2023-04-19 NOTE — Progress Notes (Signed)
Called to room to assist during endoscopic procedure.  Patient ID and intended procedure confirmed with present staff. Received instructions for my participation in the procedure from the performing physician.  

## 2023-04-20 ENCOUNTER — Telehealth: Payer: Self-pay

## 2023-04-20 NOTE — Telephone Encounter (Signed)
  Follow up Call-     04/19/2023    7:31 AM  Call back number  Post procedure Call Back phone  # [phone]  Permission to leave phone message Yes     Patient questions:  Do you have a fever, pain , or abdominal swelling? No. Pain Score  0 *  Have you tolerated food without any problems? Yes.    Have you been able to return to your normal activities? Yes.    Do you have any questions about your discharge instructions: Diet   No. Medications  No. Follow up visit  No.  Do you have questions or concerns about your Care? Yes.   Pt states she just has some soreness in her abdomen.Denies any fever or pain >4. Advised to call back if soreness gets worse or does not resolve within a couple of days. Pt verbalizes understanding.   Actions: * If pain score is 4 or above: No action needed, pain <4.

## 2023-04-21 LAB — SURGICAL PATHOLOGY

## 2023-05-18 ENCOUNTER — Encounter: Payer: Self-pay | Admitting: Gastroenterology

## 2023-08-25 ENCOUNTER — Encounter: Payer: Self-pay | Admitting: Podiatry

## 2023-08-25 ENCOUNTER — Ambulatory Visit

## 2023-08-25 ENCOUNTER — Telehealth: Payer: Self-pay | Admitting: Podiatry

## 2023-08-25 ENCOUNTER — Ambulatory Visit: Admitting: Podiatry

## 2023-08-25 DIAGNOSIS — L600 Ingrowing nail: Secondary | ICD-10-CM | POA: Diagnosis not present

## 2023-08-25 DIAGNOSIS — B351 Tinea unguium: Secondary | ICD-10-CM | POA: Diagnosis not present

## 2023-08-25 DIAGNOSIS — B49 Unspecified mycosis: Secondary | ICD-10-CM

## 2023-08-25 DIAGNOSIS — R52 Pain, unspecified: Secondary | ICD-10-CM | POA: Diagnosis not present

## 2023-08-25 DIAGNOSIS — M7751 Other enthesopathy of right foot: Secondary | ICD-10-CM

## 2023-08-25 MED ORDER — MUPIROCIN 2 % EX OINT: 1.0000 | TOPICAL_OINTMENT | Freq: Two times a day (BID) | CUTANEOUS | 2 refills | Status: AC

## 2023-08-25 MED ORDER — CEPHALEXIN 500 MG PO CAPS: 500.0000 mg | ORAL_CAPSULE | Freq: Three times a day (TID) | ORAL | 0 refills | Status: DC

## 2023-08-25 NOTE — Telephone Encounter (Signed)
 I called and talked to [facility] and gave her the provider recommendations. She voices understanding. Nothing further needed.

## 2023-08-25 NOTE — Telephone Encounter (Signed)
 I got a call from the pharmacy that the keflex will increase the to monitor for dofetilide and it could possibly cause increased prolongation with her heart problems. The pharmacy wants to know if you have any other recommendations. Please advise?

## 2023-08-25 NOTE — Patient Instructions (Signed)

## 2023-08-25 NOTE — Progress Notes (Signed)
 Subjective: Chief Complaint  Patient presents with   Ingrown Toenail    RM#11 Left foot big toe ingrown infected has been soaking daily.   68 year old female presents the office today with the above concerns, which is new.  She said the toenail has been tender and she has been having some redness.  She has been soaking daily.  No pus at this time.  Objective: AAO x3, NAD DP/PT pulses palpable bilaterally, CRT less than 3 seconds Incurvation noted along both the medial and lateral aspects of the left hallux toenail with localized edema and erythema along the corners. The lateral aspect is more symptomatic than the medial.  There is no drainage or pus or ascending cellulitis. Nail is hypertrophic, dystrophic. No pain with calf compression, swelling, warmth, erythema  Assessment: Ingrown toenail left hallux  Plan: -All treatment options discussed with the patient including all alternatives, risks, complications.  -At this time, the patient is requesting partial nail removal with chemical matricectomy to the symptomatic portion of the nail. Risks and complications were discussed with the patient for which they understand and written consent was obtained. Under sterile conditions a total of 3 mL of a mixture of 2% lidocaine plain and 0.5% Marcaine plain was infiltrated in a hallux block fashion. Once anesthetized, the skin was prepped in sterile fashion. A tourniquet was then applied. Next the medial and lateral aspect of hallux nail border was then sharply excised making sure to remove the entire offending nail border. Once the nails were ensured to be removed area was debrided and the underlying skin was intact. There is no purulence identified in the procedure. Next phenol was then applied under standard conditions and copiously irrigated. Silvadene was applied. A dry sterile dressing was applied. After application of the dressing the tourniquet was removed and there is found to be an immediate  capillary refill time to the digit. The patient tolerated the procedure well any complications. Post procedure instructions were discussed the patient for which he verbally understood. Discussed signs/symptoms of infection and directed to call the office immediately should any occur or go directly to the emergency room. In the meantime, encouraged to call the office with any questions, concerns, changes symptoms. -Keflex  -Nail sent for culture  -Patient encouraged to call the office with any questions, concerns, change in symptoms.

## 2023-09-08 ENCOUNTER — Ambulatory Visit: Admitting: Podiatry

## 2023-09-08 DIAGNOSIS — B351 Tinea unguium: Secondary | ICD-10-CM

## 2023-09-08 DIAGNOSIS — L600 Ingrowing nail: Secondary | ICD-10-CM | POA: Diagnosis not present

## 2023-09-08 MED ORDER — EFINACONAZOLE 10 % EX SOLN
1.0000 [drp] | Freq: Every day | CUTANEOUS | 11 refills | Status: DC
Start: 2023-09-08 — End: 2024-03-21

## 2023-09-08 NOTE — Progress Notes (Signed)
 Subjective: Chief Complaint  Patient presents with   Nail Problem    RM#13 Nail check patient states never received anti fungal medication prescribed last visit.Left big toe showing signs of infection redness and swelling.    68 year old female presents the office today for Evaluation after the partial nail avulsion.  She is concerned about the redness around the toe.  She still been soaking in Epsom salts as well as using mupirocin  ointment.  We held off on the oral medication that was prescribed given interactions with other medications.  Does not report any drainage or pus.  Objective: AAO x3, NAD DP/PT pulses palpable bilaterally, CRT less than 3 seconds There is low edema around the toe as well as localized erythema which I think is coming more from inflammation as opposed to infection.  There is no drainage or pus and she has not noticed any drainage either.  Mild tenderness on the surgical site.  There is no ascending cellulitis. Remaining nails are hypertrophic, dystrophic with yellow discoloration. No pain with calf compression, swelling, warmth, erythema  Assessment: Status post partial nail avulsion with localized erythema; onychomycosis  Plan: -All treatment options discussed with the patient including all alternatives, risks, complications.  -Procedure site appears to be healing well but still concerned with localized edema and erythema.  I think this could be coming more from inflammation as opposed to infection.  We discussed using antibacterial soap soaking as well as antibiotic ointment dressing changes during the day but can leave the area open at nighttime.  No improvement next week or if there is any worsening we will likely do a oral antibiotics.  Will need to contact her pharmacist about the appropriate antibiotics given her other medications -Discussed treatment options for nail fungus.  We contacted her insurance company while I was in the room.  We started a prior  authorization for Jublia . Reference [id number] -Patient encouraged to call the office with any questions, concerns, change in symptoms.   Charity Conch DPM

## 2023-09-09 ENCOUNTER — Other Ambulatory Visit: Payer: Self-pay | Admitting: Podiatry

## 2023-09-09 ENCOUNTER — Telehealth: Payer: Self-pay | Admitting: Podiatry

## 2023-09-09 MED ORDER — CICLOPIROX 8 % EX SOLN: Freq: Every day | CUTANEOUS | 2 refills | Status: DC

## 2023-09-09 NOTE — Telephone Encounter (Signed)
 Jublia  is not covered by insurance. Pharmacy is recommending the following and asking if you are willing to prescribe one of them and resend the prescription:  Ciclopirox Itraconazole Terbinafine  Send to:  [facility] [employer] - [place], [place] - [address] Phone: [phone]  Fax: [phone]

## 2023-09-16 ENCOUNTER — Emergency Department (HOSPITAL_BASED_OUTPATIENT_CLINIC_OR_DEPARTMENT_OTHER)
Admission: EM | Admit: 2023-09-16 | Discharge: 2023-09-16 | Disposition: A | Discharge status: Home / Self Care | Attending: Emergency Medicine | Admitting: Emergency Medicine

## 2023-09-16 ENCOUNTER — Encounter (HOSPITAL_BASED_OUTPATIENT_CLINIC_OR_DEPARTMENT_OTHER): Payer: Self-pay | Admitting: Emergency Medicine

## 2023-09-16 ENCOUNTER — Other Ambulatory Visit: Payer: Self-pay

## 2023-09-16 DIAGNOSIS — M75102 Unspecified rotator cuff tear or rupture of left shoulder, not specified as traumatic: Secondary | ICD-10-CM | POA: Diagnosis not present

## 2023-09-16 DIAGNOSIS — M25512 Pain in left shoulder: Secondary | ICD-10-CM | POA: Diagnosis present

## 2023-09-16 DIAGNOSIS — M12812 Other specific arthropathies, not elsewhere classified, left shoulder: Secondary | ICD-10-CM

## 2023-09-16 NOTE — ED Provider Notes (Signed)
 [place] EMERGENCY DEPARTMENT AT [facility] Provider Note   CSN: [id number] Arrival date & time: 09/16/23  1055     History Chief Complaint  Patient presents with   Arm Injury     Arm Injury  [name] is a 68 y.o. female presenting for a 58-month history of pain in the left medial shoulder that has not worsened but has not improved in that timeframe.  She presents primarily today out of concern for an injury requiring potential surgery as she has had a family member who required surgery for tendon rupture.  She does endorse weakness with abduction of the left shoulder.  Further she states that when she was playing her guitar recently she noticed that her fingers felt slightly weak compared to previous.  She denies any distal numbness or paresthesia, has no pain at rest, pain is exacerbated with overhead extension as well as abduction of the shoulder.  She dates she has not seen her physician and presents today because she was going to see primary care but was not able to get in for the next month.  At the time of her original injury she denies feeling any popping or tearing sensation, rather noted a sudden sharp pain in the left medial shoulder distal to the deltoid.  She has no previous orthopedic injuries to this shoulder of note.   Patient's recorded medical, surgical, social, medication list and allergies were reviewed in the Snapshot window as part of the initial history.   Review of Systems   Review of Systems  Musculoskeletal:  Positive for arthralgias.       Pain in left shoulder.  All other systems reviewed and are negative.   Physical Exam Updated Vital Signs BP (!) 154/75 (BP Location: Left Arm)   Pulse (!) 56   Temp 97.8 F (36.6 C) (Oral)   Resp 18   SpO2 99%  Physical Exam Vitals and nursing note reviewed.  Constitutional:      General: She is not in acute distress.    Appearance: Normal appearance.  HENT:     Head: Normocephalic and  atraumatic.     Mouth/Throat:     Mouth: Mucous membranes are moist.     Pharynx: Oropharynx is clear.  Eyes:     Extraocular Movements: Extraocular movements intact.     Conjunctiva/sclera: Conjunctivae normal.     Pupils: Pupils are equal, round, and reactive to light.  Cardiovascular:     Rate and Rhythm: Normal rate and regular rhythm.     Pulses: Normal pulses.     Heart sounds: Normal heart sounds. No murmur heard.    No friction rub. No gallop.  Pulmonary:     Effort: Pulmonary effort is normal.     Breath sounds: Normal breath sounds.  Abdominal:     General: Abdomen is flat. Bowel sounds are normal.     Palpations: Abdomen is soft.  Musculoskeletal:        General: No swelling, tenderness or deformity.     Right shoulder: Normal.     Left shoulder: No swelling, deformity, effusion, tenderness, bony tenderness or crepitus. Decreased range of motion. Decreased strength. Normal pulse.     Cervical back: Normal range of motion and neck supple.     Right lower leg: No edema.     Left lower leg: No edema.     Comments: +2 radial pulse noted at the left radius, 4 out of 5 strength noted at the left shoulder with  abduction and internal rotation.  Right shoulder has 5 out of 5 strength in all range of motion.  No sharp or dull sensation loss noted to the left hand.  Skin:    General: Skin is warm and dry.     Capillary Refill: Capillary refill takes less than 2 seconds.  Neurological:     General: No focal deficit present.     Mental Status: She is alert. Mental status is at baseline.  Psychiatric:        Mood and Affect: Mood normal.      ED Course/ Medical Decision Making/ A&P    Procedures Procedures   Medications Ordered in ED Medications - No data to display  Medical Decision Making:   [name] is a 68 y.o. female who presented to the ED today with left shoulder/arm pain detailed above.     Complete initial physical exam performed, notably the  patient  was alert and oriented in no apparent distress.  She has pain with internal rotation of the left shoulder, painful arc test is limited to approximately 80 degrees abduction without severe pain.  Further noted reduction in strength with abduction and overhead extension of the left arm at 4/5.     Reviewed and confirmed nursing documentation for past medical history, family history, social history.    Initial Assessment:   With the patient's presentation of left shoulder/arm pain, most likely diagnosis is rotator cuff injury. Other diagnoses were considered including (but not limited to) adhesive capsulitis, subacute fracture. These are considered less likely due to history of present illness and physical exam findings.     Initial Plan:  Imaging of left shoulder offered to patient, discussed potential findings and allowed for shared decision making with patient.  At this time agree with patient to defer imaging for orthopedics. Objective evaluation as below reviewed    Reassessment and Plan:   Plan to refer patient to outpatient orthopedics for evaluation of the left shoulder further and likely physical therapy referral.   Return instructions given to patient should pain worsen prior to her ability to get in with orthopedics, no other medications will be prescribed at this time as patient has history of atrial fibrillation and cannot use NSAIDs or corticosteroids.  Clinical Impression:  1. Rotator cuff arthropathy of left shoulder      Discharge   Final Clinical Impression(s) / ED Diagnoses Final diagnoses:  Rotator cuff arthropathy of left shoulder    Rx / DC Orders ED Discharge Orders     None         Juanetta Nordmann, PA 09/16/23 1151    Mozell Arias, MD 09/17/23 [phone]

## 2023-09-16 NOTE — ED Triage Notes (Signed)
 Injured left upper arm in March from reaching backwards to unplug something from wall. States pain has increasingly gotten worse since.

## 2023-09-19 ENCOUNTER — Encounter: Payer: Self-pay | Admitting: Podiatry

## 2023-09-22 ENCOUNTER — Telehealth: Payer: Self-pay

## 2023-09-22 NOTE — Telephone Encounter (Signed)
 PA request received from [employer] for Jublia  10% solution. PA submitted and awaiting response.

## 2024-03-21 ENCOUNTER — Other Ambulatory Visit (HOSPITAL_COMMUNITY)
Admission: RE | Admit: 2024-03-21 | Discharge: 2024-03-21 | Disposition: A | Discharge status: Home / Self Care | Source: Ambulatory Visit | Attending: Obstetrics and Gynecology | Admitting: Obstetrics and Gynecology

## 2024-03-21 ENCOUNTER — Encounter: Payer: Self-pay | Admitting: Obstetrics and Gynecology

## 2024-03-21 ENCOUNTER — Ambulatory Visit: Admitting: Obstetrics and Gynecology

## 2024-03-21 VITALS — BP 122/58 | HR 76 | Temp 97.8°F | Ht 67.75 in | Wt 224.0 lb

## 2024-03-21 DIAGNOSIS — R9389 Abnormal findings on diagnostic imaging of other specified body structures: Secondary | ICD-10-CM | POA: Insufficient documentation

## 2024-03-21 DIAGNOSIS — N950 Postmenopausal bleeding: Secondary | ICD-10-CM | POA: Insufficient documentation

## 2024-03-21 NOTE — Progress Notes (Signed)
 68 y.o. G48P2002 female with PMB and thickened endometrium here for referral for EMB. Married.  Referred by S.Emilio, NP Margarete SHIPPER.  No LMP recorded. Patient is postmenopausal.   She has had intermittent spotting over the past year lasting for about 3 to 4 days. Last spotting was ~1 month ago. Not sexually active. Atrial fibrillation on Eliquis  for 5 to 6 years.  02/07/2024 wet prep normal, Pap NIL 02/15/2024 Uterus 7.1 x 3.0 x 4.8 cm, anteverted.  2 small uterine fibroids, the largest measuring 1.3 cm. ET 4 mm, tiny cystic areas seen fundally, no blood flow noted. Right ovary normal.  Left ovary not visualized.   OB History  Gravida Para Term Preterm AB Living  2 2 2   2   SAB IAB Ectopic Multiple Live Births      2    # Outcome Date GA Lbr Len/2nd Weight Sex Type Anes PTL Lv  2 Term      Vag-Spont   LIV  1 Term      Vag-Spont   LIV   Past Medical History:  Diagnosis Date   A-fib (HCC)    Chicken pox    DM mellitus, gestational    HTN (hypertension)    Hyperlipidemia    Paroxysmal atrial fibrillation (HCC)    Polyarthralgia    Premature menopause    Past Surgical History:  Procedure Laterality Date   ATRIAL FIBRILLATION ABLATION     TONSILECTOMY, ADENOIDECTOMY, BILATERAL MYRINGOTOMY AND TUBES     Current Outpatient Medications on File Prior to Visit  Medication Sig Dispense Refill   acetaminophen  (TYLENOL ) 325 MG tablet Take 650 mg by mouth 2 (two) times daily as needed (pain).     amLODipine (NORVASC) 5 MG tablet Take 5 mg by mouth daily.     apixaban  (ELIQUIS ) 5 MG TABS tablet Take 5 mg by mouth 2 (two) times daily.     atorvastatin (LIPITOR) 20 MG tablet Take 20 mg by mouth daily.     cetirizine (ZYRTEC) 10 MG tablet Take 10 mg by mouth daily.     Coenzyme Q10 (CO Q10 PO) Take by mouth.     dofetilide (TIKOSYN) 500 MCG capsule Take 500 mcg by mouth 2 (two) times daily.     Multiple Vitamins-Minerals (MULTIVITAMIN WITH MINERALS) tablet Take 1 tablet by mouth  daily.     mupirocin  ointment (BACTROBAN ) 2 % Apply 1 Application topically 2 (two) times daily. 30 g 2   omeprazole (PRILOSEC) 20 MG capsule Take 20 mg by mouth every 12 (twelve) hours.      triamcinolone  acetonide 40 MG/ML SUSP 40 mg, mupirocin  cream 2 % CREA 15 g      No current facility-administered medications on file prior to visit.   Allergies  Allergen Reactions   Pork Allergy Nausea And Vomiting   Lisinopril Other (See Comments)    Dry cough   Onion Other (See Comments)    Blisters in throat and mouth   Diltiazem Rash   Tetracycline Rash   Xarelto  [Rivaroxaban ] Rash      PE Today's Vitals   03/21/24 1357  BP: (!) 122/58  Pulse: 76  Temp: 97.8 F (36.6 C)  TempSrc: Oral  SpO2: 96%  Weight: 224 lb (101.6 kg)  Height: 5' 7.75 (1.721 m)   Body mass index is 34.31 kg/m.  Physical Exam Vitals reviewed. Exam conducted with a chaperone present.  Constitutional:      General: She is not in acute distress.  Appearance: Normal appearance.  HENT:     Head: Normocephalic and atraumatic.     Nose: Nose normal.  Eyes:     Extraocular Movements: Extraocular movements intact.     Conjunctiva/sclera: Conjunctivae normal.  Pulmonary:     Effort: Pulmonary effort is normal.  Genitourinary:    General: Normal vulva.     Exam position: Lithotomy position.     Vagina: Normal. No vaginal discharge.     Cervix: Normal. No cervical motion tenderness, discharge or lesion.     Uterus: Normal. Not enlarged and not tender.      Adnexa: Right adnexa normal and left adnexa normal.  Musculoskeletal:        General: Normal range of motion.     Cervical back: Normal range of motion.  Neurological:     General: No focal deficit present.     Mental Status: She is alert.  Psychiatric:        Mood and Affect: Mood normal.        Behavior: Behavior normal.    Procedure Consent was signed. Timeout was performed. Speculum inserted into the vagina, cervix visualized and was  prepped with Hibiclens.  Cervical block was performed with 10cc 1% lidocaine (Lot [id number], Exp 2028-Feb). A single-toothed tenaculum was placed on the anterior lip of the cervix to stabilize it.  The 3 mm pipelle was introduced into the endometrial cavity without difficulty to a depth of 7cm, suction initiated and a minimal amount of tissue was obtained over 2 passes and sent to pathology.  The instruments were removed from the patient's vagina.  Minimal bleeding from the cervix was noted.  The patient tolerated the procedure well.      Assessment and Plan:        PMB (postmenopausal bleeding) -     Surgical pathology  Thickened endometrium -     Surgical pathology  Reviewed referral notes including associated workup: wet prep, Pap, and ultrasound as indicated above. Reviewed endometrial thickness is right at cutoff for recommendation for endometrial sampling Reviewed differential for postmenopausal bleeding including use of anticoagulants, endometrial hyperplasia, endometrial cancer.  Given ET at cutoff for normal, in agreement with recommendation for endometrial biopsy.  Discussed risk and benefits. Patient in agreement with procedure. Uncomplicated procedure performed, however minimal sample obtained over 2 passes.  I reviewed with patient that this is likely due to overall thin endometrium. Post procedure instructions reviewed. Will follow-up with patient once pathology is resulted.  Vera LULLA Pa, MD

## 2024-03-21 NOTE — Patient Instructions (Signed)
 It is common to have vaginal bleeding and cramping for up to 72 hours after your biopsy. Please call our office with heavy vaginal bleeding, severe abdominal pain or fever. Avoid intercourse, tampon use, douching and baths for 7 days to decrease the risk of infection.

## 2024-03-26 LAB — SURGICAL PATHOLOGY

## 2024-03-27 ENCOUNTER — Ambulatory Visit: Payer: Self-pay | Admitting: Obstetrics and Gynecology

## 2024-06-15 ENCOUNTER — Emergency Department (HOSPITAL_COMMUNITY)

## 2024-06-15 ENCOUNTER — Emergency Department (HOSPITAL_COMMUNITY)
Admission: EM | Admit: 2024-06-15 | Discharge: 2024-06-15 | Disposition: A | Discharge status: Home / Self Care | Source: Home / Self Care | Attending: Emergency Medicine | Admitting: Emergency Medicine

## 2024-06-15 DIAGNOSIS — W19XXXA Unspecified fall, initial encounter: Secondary | ICD-10-CM

## 2024-06-15 NOTE — ED Triage Notes (Signed)
 Pt reports fall x Saturday on ice, pt c/o lower back pain and tailbone pain. Pt reports pain is causing her knees to go out on her.

## 2024-06-15 NOTE — Discharge Instructions (Signed)
 As discussed, your evaluation today has been largely reassuring.  But, it is important that you monitor your condition carefully, and do not hesitate to return to the ED if you develop new, or concerning changes in your condition.  In addition to Tylenol  and ice packs, Voltaren gel is appropriate for additional pain control.

## 2024-06-15 NOTE — ED Provider Notes (Signed)
 " [place] EMERGENCY DEPARTMENT AT [facility] Provider Note   CSN: [id number] Arrival date & time: 06/15/24  9056     Patient presents with: Fall and Tailbone Pain   [name] is a 69 y.o. female.   HPI Patient presents with pain in her lower back, pelvis, both knees following a fall that occurred 5 days ago.  Fall was mechanical, full recall.  No head trauma, loss of consciousness.  Since the fall she has had persistent pain in both knees, hips, low back.  Pain was substantial enough patient almost fell again after feeling particularly weak yesterday. Minimal relief with Tylenol .     Prior to Admission medications  Medication Sig Start Date End Date Taking? Authorizing Provider  acetaminophen  (TYLENOL ) 325 MG tablet Take 650 mg by mouth 2 (two) times daily as needed (pain).    [provider]  amLODipine (NORVASC) 5 MG tablet Take 5 mg by mouth daily.    [provider]  apixaban  (ELIQUIS ) 5 MG TABS tablet Take 5 mg by mouth 2 (two) times daily.    [provider]  atorvastatin (LIPITOR) 20 MG tablet Take 20 mg by mouth daily.    [provider]  cetirizine (ZYRTEC) 10 MG tablet Take 10 mg by mouth daily.    [provider]  Coenzyme Q10 (CO Q10 PO) Take by mouth.    [provider]  dofetilide (TIKOSYN) 500 MCG capsule Take 500 mcg by mouth 2 (two) times daily.    [provider]  Multiple Vitamins-Minerals (MULTIVITAMIN WITH MINERALS) tablet Take 1 tablet by mouth daily.    [provider]  mupirocin  ointment (BACTROBAN ) 2 % Apply 1 Application topically 2 (two) times daily. 08/25/23   Gershon Donnice SAUNDERS, DPM  omeprazole (PRILOSEC) 20 MG capsule Take 20 mg by mouth every 12 (twelve) hours.  05/30/14   [provider]  triamcinolone  acetonide 40 MG/ML SUSP 40 mg, mupirocin  cream 2 % CREA 15 g     [provider]    Allergies: Pork allergy, Lisinopril, Onion, Diltiazem,  Other, Tetracycline, and Xarelto  [rivaroxaban ]    Review of Systems  Updated Vital Signs BP (!) 172/74 (BP Location: Right Arm)   Pulse 60   Temp 98.1 F (36.7 C) (Oral)   Resp 17   SpO2 98%   Physical Exam Vitals and nursing note reviewed.  Constitutional:      General: She is not in acute distress.    Appearance: She is well-developed.  HENT:     Head: Normocephalic and atraumatic.  Eyes:     Conjunctiva/sclera: Conjunctivae normal.  Pulmonary:     Effort: No respiratory distress.     Breath sounds: No stridor.  Abdominal:     General: There is no distension.  Musculoskeletal:       Arms:  Skin:    General: Skin is warm and dry.  Neurological:     Mental Status: She is alert and oriented to person, place, and time.     Cranial Nerves: No cranial nerve deficit.     Comments: Unremarkable neuroexam  Psychiatric:        Mood and Affect: Mood normal.     (all labs ordered are listed, but only abnormal results are displayed) Labs Reviewed - No data to display  EKG: None  Radiology: DG Lumbar Spine 2-3 Views Result Date: 06/15/2024 EXAM: 3 VIEW(S) XRAY OF THE LUMBAR SPINE 06/15/2024 01:07:38 PM COMPARISON: None available. CLINICAL HISTORY: Fall. FINDINGS:  LUMBAR SPINE: BONES: Conventional lumbosacral anatomy is assumed with 5 non-rib-bearing, lumbar-type vertebral bodies. Vertebral body heights are maintained. Alignment is normal. DISCS AND DEGENERATIVE CHANGES: Moderate bilateral facet arthropathy at L4-L5 and L5-S1. SOFT TISSUES: No acute abnormality. IMPRESSION: 1. No evidence of acute traumatic injury. Electronically signed by: Ryan Chess MD 06/15/2024 02:28 PM EST RP Workstation: [id number]   DG Hip Unilat With Pelvis 2-3 Views Left Result Date: 06/15/2024 EXAM: 3 VIEW(S) XRAY OF THE PELVIS AND LEFT HIP 06/15/2024 01:07:38 PM COMPARISON: None available. CLINICAL HISTORY: Fall. FINDINGS: BONES AND JOINTS: SI joints are symmetric. No acute fracture. Bilateral hips  demonstrate normal alignment. SOFT TISSUES: Unremarkable. IMPRESSION: 1. No evidence of acute traumatic injury. Electronically signed by: Ryan Chess MD 06/15/2024 02:26 PM EST RP Workstation: [id number]   DG Knee Complete 4 Views Right Result Date: 06/15/2024 EXAM: 4 VIEW(S) XRAY OF THE RIGHT KNEE 06/15/2024 01:07:38 PM COMPARISON: None available. CLINICAL HISTORY: Fall. FINDINGS: BONES AND JOINTS: No acute fracture. No malalignment. No significant joint effusion. Mild medial compartmental osteoarthritis with marginal osteophyte formation. SOFT TISSUES: Unremarkable. IMPRESSION: 1. No evidence of acute traumatic injury. Electronically signed by: Ryan Chess MD 06/15/2024 02:26 PM EST RP Workstation: [id number]   DG Knee Complete 4 Views Left Result Date: 06/15/2024 EXAM: 4 VIEW(S) XRAY OF THE LEFT KNEE 06/15/2024 01:07:38 PM COMPARISON: None available. CLINICAL HISTORY: Fall. FINDINGS: BONES AND JOINTS: No acute fracture. No malalignment. No significant joint effusion. Mild medial compartment osteoarthritis with joint space narrowing and osteophyte formation. SOFT TISSUES: Unremarkable. IMPRESSION: 1. No evidence of acute traumatic injury. Electronically signed by: Ryan Chess MD 06/15/2024 02:24 PM EST RP Workstation: [id number]   DG Hip Unilat With Pelvis 2-3 Views Right Result Date: 06/15/2024 EXAM: 2 VIEW(S) XRAY OF THE RIGHT HIP 06/15/2024 01:07:38 PM COMPARISON: None available. CLINICAL HISTORY: Fall. FINDINGS: BONES AND JOINTS: No acute fracture. Normal alignment. SOFT TISSUES: Unremarkable. IMPRESSION: 1. No evidence of acute traumatic injury. Electronically signed by: Ryan Chess MD 06/15/2024 02:22 PM EST RP Workstation: [id number]     Procedures   Medications Ordered in the ED - No data to display                                  Medical Decision Making Adult female presents with ongoing pain in multiple areas following a fall that occurred 5 days ago.  Passage time is  reassuring for low suspicion of decompensated state, no evidence for neuro dysfunction.  Fracture versus soft tissue injury considered.  X-rays ordered.  Amount and/or Complexity of Data Reviewed Radiology: ordered and independent interpretation performed. Decision-making details documented in ED Course.  3:06 PM Patient awake, alert, in no distress.  I have reviewed the x-rays, discussed them with her, no distal neurovascular compromise, no fractures on x-ray, no hemodynamic instability, patient appropriate for ongoing outpatient management. Patient in no distress, awake, alert, I reviewed the x-rays, no acute fractures in any of the above studies. With passage of time, reassuring x-rays patient appropriate for discharge with outpatient follow-up.  Final diagnoses:  Fall, initial encounter    ED Discharge Orders     None          Garrick Charleston, MD 06/15/24 1506  "
